# Patient Record
Sex: Female | Born: 1980
Health system: Southern US, Community
[De-identification: ages and names within clinical notes are randomized; demographics above are authoritative.]

## PROBLEM LIST (undated history)

## (undated) DIAGNOSIS — K219 Gastro-esophageal reflux disease without esophagitis: Secondary | ICD-10-CM

## (undated) DIAGNOSIS — E785 Hyperlipidemia, unspecified: Secondary | ICD-10-CM

## (undated) DIAGNOSIS — I2510 Atherosclerotic heart disease of native coronary artery without angina pectoris: Secondary | ICD-10-CM

## (undated) DIAGNOSIS — I10 Essential (primary) hypertension: Secondary | ICD-10-CM

## (undated) DIAGNOSIS — D849 Immunodeficiency, unspecified: Secondary | ICD-10-CM

## (undated) DIAGNOSIS — J45909 Unspecified asthma, uncomplicated: Secondary | ICD-10-CM

## (undated) HISTORY — PX: ANKLE SURGERY: SHX546

## (undated) HISTORY — DX: Essential (primary) hypertension: I10

## (undated) HISTORY — DX: Hyperlipidemia, unspecified: E78.5

## (undated) HISTORY — DX: Unspecified asthma, uncomplicated: J45.909

## (undated) HISTORY — DX: Gastro-esophageal reflux disease without esophagitis: K21.9

## (undated) HISTORY — DX: Atherosclerotic heart disease of native coronary artery without angina pectoris: I25.10

---

## 2006-12-19 ENCOUNTER — Other Ambulatory Visit: Admission: RE | Admit: 2006-12-19 | Discharge: 2006-12-19 | Payer: Self-pay | Admitting: Family Medicine

## 2016-02-02 ENCOUNTER — Other Ambulatory Visit: Payer: Self-pay | Admitting: Ophthalmology

## 2016-02-02 DIAGNOSIS — H04013 Acute dacryoadenitis, bilateral lacrimal glands: Secondary | ICD-10-CM

## 2016-02-05 ENCOUNTER — Other Ambulatory Visit: Payer: Self-pay | Admitting: Ophthalmology

## 2016-02-05 ENCOUNTER — Ambulatory Visit
Admission: RE | Admit: 2016-02-05 | Discharge: 2016-02-05 | Disposition: A | Discharge status: Home / Self Care | Payer: Self-pay | Source: Ambulatory Visit | Attending: Ophthalmology | Admitting: Ophthalmology

## 2016-02-05 DIAGNOSIS — H04013 Acute dacryoadenitis, bilateral lacrimal glands: Secondary | ICD-10-CM

## 2016-02-05 MED ORDER — IOPAMIDOL (ISOVUE-300) INJECTION 61%
75.0000 mL | Freq: Once | INTRAVENOUS | Status: AC | PRN
  Administered 2016-02-05: 75 mL via INTRAVENOUS

## 2016-02-11 ENCOUNTER — Other Ambulatory Visit: Payer: Self-pay

## 2016-03-01 ENCOUNTER — Ambulatory Visit: Payer: BLUE CROSS/BLUE SHIELD | Admitting: Allergy and Immunology

## 2017-01-12 ENCOUNTER — Ambulatory Visit (INDEPENDENT_AMBULATORY_CARE_PROVIDER_SITE_OTHER): Payer: Managed Care, Other (non HMO)

## 2017-01-12 ENCOUNTER — Encounter (HOSPITAL_COMMUNITY): Payer: Self-pay | Admitting: *Deleted

## 2017-01-12 ENCOUNTER — Other Ambulatory Visit: Payer: Self-pay

## 2017-01-12 ENCOUNTER — Ambulatory Visit (HOSPITAL_COMMUNITY)
Admission: EM | Admit: 2017-01-12 | Discharge: 2017-01-12 | Disposition: A | Discharge status: Home / Self Care | Payer: Managed Care, Other (non HMO)

## 2017-01-12 DIAGNOSIS — R079 Chest pain, unspecified: Secondary | ICD-10-CM

## 2017-01-12 DIAGNOSIS — R5383 Other fatigue: Secondary | ICD-10-CM | POA: Diagnosis not present

## 2017-01-12 DIAGNOSIS — R1110 Vomiting, unspecified: Secondary | ICD-10-CM

## 2017-01-12 DIAGNOSIS — R0789 Other chest pain: Secondary | ICD-10-CM | POA: Diagnosis not present

## 2017-01-12 HISTORY — DX: Immunodeficiency, unspecified: D84.9

## 2017-01-12 NOTE — ED Provider Notes (Signed)
[facility]    CSN: [id number] Arrival date & time: 01/12/17  1116     History   Chief Complaint Chief Complaint  Patient presents with  . Chest Pain    HPI Brittney Bass is a 36 y.o. female.   36 year old female presents with exertional chest pain is off and on for approximately one week. The pain is located to the upper mid anterior chest. No radiation. She states that it occurs with walking short distances but not at rest. After walking and having the chest pain rest  updates the pain. last evening she had an episode of vomiting after she ate dinner. She did not feel sick or have abdominal pain prior to this episode. Pain is not affected by movement or body position. Or deep breathing. She describes it as a pressure feeling. No associated dyspnea or exertional dyspnea. Denies family history of cardiovascular disease or emboli or death prior to age 43. Nonsmoker, denies personal medical history of hypertension or other problems. Nonsmoker. Currently not having any type of chest discomfort. Sitting on the exam table, relaxed posturing, speech showing no signs of distress.      Past Medical History:  Diagnosis Date  . Immune deficiency disorder (HCC)     There are no active problems to display for this patient.   History reviewed. No pertinent surgical history.  OB History    No data available       Home Medications    Prior to Admission medications   Medication Sig Start Date End Date Taking? Authorizing Provider  Hydroxychloroquine Sulfate (PLAQUENIL PO) Take by mouth.   Yes [provider]    Family History History reviewed. No pertinent family history. No CAD, early deaths, heart arythmias.  Social History Social History  Substance Use Topics  . Smoking status: Not on file  . Smokeless tobacco: Not on file  . Alcohol use No     Allergies   Patient has no known allergies.   Review of Systems Review of Systems    Constitutional: Positive for activity change and fatigue.  HENT: Negative.   Respiratory: Negative for cough, shortness of breath and stridor.   Cardiovascular: Positive for chest pain. Negative for palpitations and leg swelling.  Gastrointestinal: Positive for vomiting. Negative for abdominal pain.  Genitourinary: Negative.   Musculoskeletal: Negative.   Skin: Negative.   Neurological: Negative.      Physical Exam Triage Vital Signs ED Triage Vitals [01/12/17 1133]  Enc Vitals Group     BP 127/73     Pulse Rate 86     Resp 16     Temp 98.8 F (37.1 C)     Temp Source Oral     SpO2 100 %     Weight 220 lb (99.8 kg)     Height 5\' 7"  (1.702 m)     Head Circumference      Peak Flow      Pain Score      Pain Loc      Pain Edu?      Excl. in GC?    No data found.   Updated Vital Signs BP 127/73 (BP Location: Right Arm)   Pulse 86   Temp 98.8 F (37.1 C) (Oral)   Resp 16   Ht 5\' 7"  (1.702 m)   Wt 220 lb (99.8 kg)   LMP 01/05/2017   SpO2 100%   BMI 34.46 kg/m   Visual Acuity Right Eye Distance:  Left Eye Distance:   Bilateral Distance:    Right Eye Near:   Left Eye Near:    Bilateral Near:     Physical Exam  Constitutional: She is oriented to person, place, and time. She appears well-developed and well-nourished. No distress.  HENT:  Head: Normocephalic and atraumatic.  Eyes: Pupils are equal, round, and reactive to light. EOM are normal.  Neck: Normal range of motion. Neck supple. No JVD present.  Cardiovascular: Normal rate, regular rhythm, normal heart sounds and intact distal pulses.   Pulmonary/Chest: Effort normal and breath sounds normal. No respiratory distress. She has no wheezes. She has no rales. She exhibits no tenderness.  Abdominal: Soft. She exhibits no mass. There is no tenderness. There is no rebound and no guarding.  Musculoskeletal: Normal range of motion. She exhibits no edema.  Lymphadenopathy:    She has no cervical adenopathy.   Neurological: She is alert and oriented to person, place, and time. No cranial nerve deficit.  Skin: Skin is warm and dry. Capillary refill takes less than 2 seconds. She is not diaphoretic.  Psychiatric: She has a normal mood and affect.  Nursing note and vitals reviewed.    UC Treatments / Results  Labs (all labs ordered are listed, but only abnormal results are displayed) Labs Reviewed - No data to display  EKG  EKG Interpretation None     ED ECG REPORT   Date: 01/12/2017  Rate: 100  Rhythm: normal sinus rhythm  QRS Axis: normal  Intervals: normal  ST/T Wave abnormalities: nonspecific T wave changes  Conduction Disutrbances:none  Narrative Interpretation:   Old EKG Reviewed: none available  I have personally reviewed the EKG tracing and agree with the computerized printout as noted.   Radiology No results found.  Procedures Procedures (including critical care time)  Medications Ordered in UC Medications - No data to display   Initial Impression / Assessment and Plan / UC Course  I have reviewed the triage vital signs and the nursing notes.  Pertinent labs & imaging results that were available during my care of the patient were reviewed by me and considered in my medical decision making (see chart for details).    Your chest x-ray and EKG are essentially normal. They are not diagnostic of any particular condition. The source or etiology of your pain is uncertain. Despite the fact that your physical exam and test are normal it is still possible that this may be your heart or lungs. We have to consider potential serious problems first. You can have been given the choice to go to the emergency department to have tests to diagnose the problem or at least alleviate serious diagnoses at this time or to see your primary care doctor tomorrow. Knowing the potential consequences of the delay of workup at this time you have chosen to see your primary care provider tomorrow. If  you get worse, increased chest pain, shortness of breath, sweating, vomiting, getting sick, fainting or any other abnormalities you are to call 911 or go to emergency department promptly.     Final Clinical Impressions(s) / UC Diagnoses   Final diagnoses:  None    New Prescriptions New Prescriptions   No medications on file     Controlled Substance Prescriptions [place] Controlled Substance Registry consulted? Not Applicable   Hayden RasmussenMabe, Hindy Perrault, NP 01/12/17 1358

## 2017-01-12 NOTE — Discharge Instructions (Signed)
Your chest x-ray and EKG are essentially normal. They are not diagnostic of any particular condition. The source or etiology of your pain is uncertain. Despite the fact that your physical exam and test are normal it is still possible that this may be your heart or lungs. We have to consider potential serious problems first. You can have been given the choice to go to the emergency department to have tests to diagnose the problem or at least alleviate serious diagnoses at this time or to see your primary care doctor tomorrow. Knowing the potential consequences of the delay of workup at this time you have chosen to see your primary care provider tomorrow. If you get worse, increased chest pain, shortness of breath, sweating, vomiting, getting sick, fainting or any other abnormalities you are to call 911 or go to emergency department promptly.

## 2017-01-12 NOTE — ED Triage Notes (Signed)
Pt   Reports     Chest   Pain     On   Exertion  Pain is  Not     Worse  On  Deep      Or movement     It only   Seems    Worse  On   Exertion       She   Reports      Some  Fatigue   As   Well

## 2017-07-04 ENCOUNTER — Ambulatory Visit
Admission: RE | Admit: 2017-07-04 | Discharge: 2017-07-04 | Disposition: A | Discharge status: Home / Self Care | Payer: 59 | Source: Ambulatory Visit | Attending: Family Medicine | Admitting: Family Medicine

## 2017-07-04 ENCOUNTER — Other Ambulatory Visit: Payer: Self-pay | Admitting: Family Medicine

## 2017-07-04 DIAGNOSIS — R05 Cough: Secondary | ICD-10-CM

## 2017-07-04 DIAGNOSIS — R053 Chronic cough: Secondary | ICD-10-CM

## 2017-08-09 ENCOUNTER — Encounter (HOSPITAL_COMMUNITY): Payer: Self-pay | Admitting: *Deleted

## 2017-08-09 ENCOUNTER — Emergency Department (HOSPITAL_COMMUNITY)
Admission: EM | Admit: 2017-08-09 | Discharge: 2017-08-09 | Disposition: A | Discharge status: Home / Self Care | Payer: 59 | Attending: Emergency Medicine | Admitting: Emergency Medicine

## 2017-08-09 ENCOUNTER — Other Ambulatory Visit: Payer: Self-pay

## 2017-08-09 ENCOUNTER — Emergency Department (HOSPITAL_COMMUNITY): Payer: 59

## 2017-08-09 DIAGNOSIS — J9801 Acute bronchospasm: Secondary | ICD-10-CM | POA: Diagnosis not present

## 2017-08-09 DIAGNOSIS — R05 Cough: Secondary | ICD-10-CM | POA: Diagnosis present

## 2017-08-09 DIAGNOSIS — D649 Anemia, unspecified: Secondary | ICD-10-CM | POA: Diagnosis not present

## 2017-08-09 DIAGNOSIS — R059 Cough, unspecified: Secondary | ICD-10-CM

## 2017-08-09 LAB — BASIC METABOLIC PANEL
Anion gap: 11 (ref 5–15)
BUN: 5 mg/dL — ABNORMAL LOW (ref 6–20)
CO2: 23 mmol/L (ref 22–32)
Calcium: 8.5 mg/dL — ABNORMAL LOW (ref 8.9–10.3)
Chloride: 103 mmol/L (ref 101–111)
Creatinine, Ser: 0.87 mg/dL (ref 0.44–1.00)
GFR calc Af Amer: >60 mL/min (ref >60)
GFR calc non Af Amer: >60 mL/min (ref >60)
Glucose, Bld: 95 mg/dL (ref 65–99)
Potassium: 3.6 mmol/L (ref 3.5–5.1)
Sodium: 137 mmol/L (ref 135–145)

## 2017-08-09 LAB — CBC
HCT: 33.1 % — ABNORMAL LOW (ref 36.0–46.0)
Hemoglobin: 10.2 g/dL — ABNORMAL LOW (ref 12.0–15.0)
MCH: 24.6 pg — ABNORMAL LOW (ref 26.0–34.0)
MCHC: 30.8 g/dL (ref 30.0–36.0)
MCV: 80 fL (ref 78.0–100.0)
Platelets: 462 10*3/uL — ABNORMAL HIGH (ref 150–400)
RBC: 4.14 MIL/uL (ref 3.87–5.11)
RDW: 14.2 % (ref 11.5–15.5)
WBC: 8.1 10*3/uL (ref 4.0–10.5)

## 2017-08-09 MED ORDER — PREDNISONE 20 MG PO TABS
60.0000 mg | ORAL_TABLET | Freq: Once | ORAL | Status: AC
Start: 2017-08-09 — End: 2017-08-09
  Administered 2017-08-09: 60 mg via ORAL
  Filled 2017-08-09: qty 3

## 2017-08-09 MED ORDER — PREDNISONE 50 MG PO TABS: 50.0000 mg | ORAL_TABLET | Freq: Every day | ORAL | 0 refills | Status: DC

## 2017-08-09 MED ORDER — IPRATROPIUM-ALBUTEROL 0.5-2.5 (3) MG/3ML IN SOLN
3.0000 mL | Freq: Once | RESPIRATORY_TRACT | Status: AC
  Administered 2017-08-09: 3 mL via RESPIRATORY_TRACT
  Filled 2017-08-09: qty 3

## 2017-08-09 MED ORDER — ALBUTEROL SULFATE HFA 108 (90 BASE) MCG/ACT IN AERS: 1.0000 | INHALATION_SPRAY | Freq: Four times a day (QID) | RESPIRATORY_TRACT | 0 refills | Status: DC | PRN

## 2017-08-09 NOTE — ED Provider Notes (Signed)
[facility] Provider Note   CSN: [id number] Arrival date & time: 08/09/17  1639     History   Chief Complaint Chief Complaint  Patient presents with  . Cough    HPI Brittney Bass is a 37 y.o. female.  HPI Patient presented to the emergency room for evaluation of persistent cough and congestion.  Patient states she has been having trouble for the last 2 months or so.  She has a chronic cough bringing up yellow mucus.  The symptoms seem to be worse at night when she is lying flat.  She has seen her primary doctor couple times and has been on a couple rounds of antibiotics.  She has also been on steroids that seem to help temporarily.  Patient was also referred to an ENT doctor.  Patient has been on antacids.  He has no new environmental triggers.  No fevers or chills. Past Medical History:  Diagnosis Date  . Immune deficiency disorder (HCC)     There are no active problems to display for this patient.   History reviewed. No pertinent surgical history.  OB History    No data available       Home Medications    Prior to Admission medications   Medication Sig Start Date End Date Taking? Authorizing Provider  albuterol (PROVENTIL HFA;VENTOLIN HFA) 108 (90 Base) MCG/ACT inhaler Inhale 1-2 puffs into the lungs every 6 (six) hours as needed for wheezing or shortness of breath. 08/09/17   Linwood DibblesKnapp, Maloree Uplinger, MD  Hydroxychloroquine Sulfate (PLAQUENIL PO) Take by mouth.    [provider]  predniSONE (DELTASONE) 50 MG tablet Take 1 tablet (50 mg total) by mouth daily. 08/09/17   Linwood DibblesKnapp, Deina Lipsey, MD    Family History No family history on file.  Social History Social History   Tobacco Use  . Smoking status: Never Smoker  . Smokeless tobacco: Never Used  Substance Use Topics  . Alcohol use: No  . Drug use: Not on file     Allergies   Patient has no known allergies.   Review of Systems Review of Systems  All other systems reviewed and  are negative.    Physical Exam Updated Vital Signs BP 128/86 (BP Location: Right Arm)   Pulse 87   Temp 98.9 F (37.2 C) (Oral)   Resp 16   Ht 1.702 m (5\' 7" )   Wt 104.3 kg (230 lb)   LMP 07/26/2017   SpO2 100%   BMI 36.02 kg/m   Physical Exam  Constitutional: She appears well-developed and well-nourished. No distress.  HENT:  Head: Normocephalic and atraumatic.  Right Ear: External ear normal.  Left Ear: External ear normal.  Eyes: Conjunctivae are normal. Right eye exhibits no discharge. Left eye exhibits no discharge. No scleral icterus.  Neck: Neck supple. No tracheal deviation present.  Cardiovascular: Normal rate, regular rhythm and intact distal pulses.  Pulmonary/Chest: Effort normal and breath sounds normal. No stridor. No respiratory distress. She has no wheezes. She has no rales.  Frequently coughing  Abdominal: Soft. Bowel sounds are normal. She exhibits no distension. There is no tenderness. There is no rebound and no guarding.  Musculoskeletal: She exhibits no edema or tenderness.  Neurological: She is alert. She has normal strength. No cranial nerve deficit (no facial droop, extraocular movements intact, no slurred speech) or sensory deficit. She exhibits normal muscle tone. She displays no seizure activity. Coordination normal.  Skin: Skin is warm and dry. No rash noted.  Psychiatric: She has a normal mood and affect.  Nursing note and vitals reviewed.    ED Treatments / Results  Labs (all labs ordered are listed, but only abnormal results are displayed) Labs Reviewed  CBC - Abnormal; Notable for the following components:      Result Value   Hemoglobin 10.2 (*)    HCT 33.1 (*)    MCH 24.6 (*)    Platelets 462 (*)    All other components within normal limits  BASIC METABOLIC PANEL - Abnormal; Notable for the following components:   BUN 5 (*)    Calcium 8.5 (*)    All other components within normal limits    EKG  EKG  Interpretation  Date/Time:  Wednesday August 09 2017 16:46:58 EST Ventricular Rate:  88 PR Interval:  128 QRS Duration: 82 QT Interval:  390 QTC Calculation: 471 R Axis:   -24 Text Interpretation:  Normal sinus rhythm Left ventricular hypertrophy with repolarization abnormality Abnormal ECG No old tracing to compare Confirmed by Linwood Dibbles [phone]) on 08/09/2017 7:29:16 PM       Radiology Dg Chest 2 View  Result Date: 08/09/2017 CLINICAL DATA:  Productive cough. EXAM: CHEST - 2 VIEW COMPARISON:  07/04/2017 FINDINGS: The heart size and mediastinal contours are within normal limits. Both lungs are clear. The visualized skeletal structures are unremarkable. IMPRESSION: Normal exam. Electronically Signed   By: Francene Boyers M.D.   On: 08/09/2017 18:55    Procedures Procedures (including critical care time)  Medications Ordered in ED Medications  ipratropium-albuterol (DUONEB) 0.5-2.5 (3) MG/3ML nebulizer solution 3 mL (3 mLs Nebulization Given 08/09/17 1949)  predniSONE (DELTASONE) tablet 60 mg (60 mg Oral Given 08/09/17 1949)     Initial Impression / Assessment and Plan / ED Course  I have reviewed the triage vital signs and the nursing notes.  Pertinent labs & imaging results that were available during my care of the patient were reviewed by me and considered in my medical decision making (see chart for details).  Clinical Course as of Aug 09 2105  Wed Aug 09, 2017  2103 Pt is feeling much better after her breathing treatments and steroids.  No more coughing.  [JK]    Clinical Course User Index [JK] Linwood Dibbles, MD   Patient presents to the emergency room for evaluation of persistent cough for the last several months.  Patient's laboratory tests show a mild anemia but I do not think this is clinically significant.  Chest x-ray without any acute abnormality.  Patient improved after breathing treatments and steroids.  Wonder if she might be having recurrent bronchospasm as a source of  her coughing.  I will discharge her home with prescription for prednisone and inhaler.  I recommend she follow-up with her primary doctor and consider seeing a pulmonologist for possible PFTs.  Final Clinical Impressions(s) / ED Diagnoses   Final diagnoses:  Cough  Bronchospasm  Anemia, unspecified type    ED Discharge Orders        Ordered    predniSONE (DELTASONE) 50 MG tablet  Daily     08/09/17 2106    albuterol (PROVENTIL HFA;VENTOLIN HFA) 108 (90 Base) MCG/ACT inhaler  Every 6 hours PRN     08/09/17 2106       Linwood Dibbles, MD 08/09/17 2108

## 2017-08-09 NOTE — ED Triage Notes (Signed)
The pt is c/o a cold and a cough for 2 months chest congestion and soreness for the past 3 days  She has seen her regular doctor x 2 since this started  She saw a ent yesterday  Who could not  See a reason for her symptoms.  No temp just constant coughing especially when she lies down to sleep  The coughing keeps her awake and that's when her chest hurts her with the cough

## 2017-08-09 NOTE — Discharge Instructions (Signed)
Follow-up with your primary care doctor, consider seeing a pulmonologist for further evaluation, take the medications as prescribed

## 2017-09-29 ENCOUNTER — Encounter: Payer: Self-pay | Admitting: Pulmonary Disease

## 2017-09-29 ENCOUNTER — Other Ambulatory Visit (INDEPENDENT_AMBULATORY_CARE_PROVIDER_SITE_OTHER): Payer: 59

## 2017-09-29 ENCOUNTER — Telehealth: Payer: Self-pay | Admitting: Pulmonary Disease

## 2017-09-29 ENCOUNTER — Ambulatory Visit: Payer: 59 | Admitting: Pulmonary Disease

## 2017-09-29 VITALS — BP 140/98 | HR 109 | Ht 67.0 in | Wt 232.6 lb

## 2017-09-29 DIAGNOSIS — R05 Cough: Secondary | ICD-10-CM | POA: Diagnosis not present

## 2017-09-29 DIAGNOSIS — L509 Urticaria, unspecified: Secondary | ICD-10-CM | POA: Diagnosis not present

## 2017-09-29 DIAGNOSIS — R059 Cough, unspecified: Secondary | ICD-10-CM

## 2017-09-29 LAB — CBC WITH DIFFERENTIAL/PLATELET
Basophils Absolute: 0.1 K/uL (ref 0.0–0.1)
Basophils Relative: 0.7 % (ref 0.0–3.0)
Eosinophils Absolute: 1.1 K/uL — ABNORMAL HIGH (ref 0.0–0.7)
Eosinophils Relative: 14.4 % — ABNORMAL HIGH (ref 0.0–5.0)
HCT: 30.5 % — ABNORMAL LOW (ref 36.0–46.0)
Hemoglobin: 9.6 g/dL — ABNORMAL LOW (ref 12.0–15.0)
Lymphocytes Relative: 19.8 % (ref 12.0–46.0)
Lymphs Abs: 1.5 K/uL (ref 0.7–4.0)
MCHC: 31.5 g/dL (ref 30.0–36.0)
MCV: 74.1 fl — ABNORMAL LOW (ref 78.0–100.0)
Monocytes Absolute: 0.7 K/uL (ref 0.1–1.0)
Monocytes Relative: 8.7 % (ref 3.0–12.0)
Neutro Abs: 4.3 K/uL (ref 1.4–7.7)
Neutrophils Relative %: 56.4 % (ref 43.0–77.0)
Platelets: 494 K/uL — ABNORMAL HIGH (ref 150.0–400.0)
RBC: 4.12 Mil/uL (ref 3.87–5.11)
RDW: 14.8 % (ref 11.5–15.5)
WBC: 7.7 K/uL (ref 4.0–10.5)

## 2017-09-29 LAB — NITRIC OXIDE: Nitric Oxide: 61

## 2017-09-29 MED ORDER — PREDNISONE 10 MG PO TABS: ORAL_TABLET | ORAL | 0 refills | Status: DC

## 2017-09-29 MED ORDER — ALBUTEROL SULFATE HFA 108 (90 BASE) MCG/ACT IN AERS: 1.0000 | INHALATION_SPRAY | Freq: Four times a day (QID) | RESPIRATORY_TRACT | 1 refills | Status: DC | PRN

## 2017-09-29 MED ORDER — DIPHENHYDRAMINE HCL 50 MG/ML IJ SOLN
25.0000 mg | Freq: Once | INTRAMUSCULAR | Status: AC
  Administered 2017-09-29: 25 mg via INTRAMUSCULAR

## 2017-09-29 MED ORDER — METHYLPREDNISOLONE ACETATE 80 MG/ML IJ SUSP
80.0000 mg | Freq: Once | INTRAMUSCULAR | Status: AC
  Administered 2017-09-29: 80 mg via INTRAMUSCULAR

## 2017-09-29 MED ORDER — FLUTICASONE FUROATE-VILANTEROL 200-25 MCG/INH IN AEPB: 1.0000 | INHALATION_SPRAY | Freq: Every day | RESPIRATORY_TRACT | 0 refills | Status: DC

## 2017-09-29 NOTE — Progress Notes (Signed)
Brittney Bass    [id number]    1980-10-18  Primary Care Physician:Dewey, Christell Constant, MD  Referring Physician: Donnetta Hail, MD [address] [place], [place] [address]  Chief complaint: Consult for cough  HPI: 37 year old with history of connective tissue disease, possible Sjogren's.  Comes with complains of cough since December 2019 Initially this was thought to be secondary to GERD.  She has given omeprazole or Nexium with no improvement of symptoms.  She is also seen ENT who diagnosed her with acute sinusitis and was treated with doxycycline x2 and a Z-Pak She had been to the ED in the last 3 months and given prednisone and albuterol inhaler which with only thinks that helped with her breathing.  However symptoms worsen after the prednisone was stopped.  Chief complaint today is cough, dyspnea, wheezing which keeps her up at night.  Denies any sputum production, fevers, chills. She is being followed by Dr. Dierdre Forth for connective tissue disease, likely Sjogren's.  This was diagnosed 2 years ago when she presented with eyelid swelling.  She is been currently maintained on Plaquenil She had allergy testing several years ago and was told she was sensitive to mold, dust, grass, trees.  Pets: She has a dog. Occupation: Child psychotherapist Exposures: No dampness, mold.  She uses a humidifier at home.  She has a bird feeder in the backyard and recently joined work in a new facility with 2 indoor birds Smoking history: Never smoker Travel history: Not significant  Outpatient Encounter Medications as of 09/29/2017  Medication Sig  . albuterol (PROVENTIL HFA;VENTOLIN HFA) 108 (90 Base) MCG/ACT inhaler Inhale 1-2 puffs into the lungs every 6 (six) hours as needed for wheezing or shortness of breath.  . cetirizine (ZYRTEC) 10 MG tablet Take 10 mg by mouth daily.  . Hydroxychloroquine Sulfate (PLAQUENIL PO) Take by mouth.  . montelukast (SINGULAIR) 10 MG tablet   .  [DISCONTINUED] predniSONE (DELTASONE) 50 MG tablet Take 1 tablet (50 mg total) by mouth daily.   No facility-administered encounter medications on file as of 09/29/2017.     Allergies as of 09/29/2017 - Review Complete 09/29/2017  Allergen Reaction Noted  . Ibuprofen Hives 09/29/2017  . Penicillins Itching 09/29/2017  . Xanax [alprazolam] Itching 09/29/2017    Past Medical History:  Diagnosis Date  . Immune deficiency disorder [facility])     Past Surgical History:  Procedure Laterality Date  . ANKLE SURGERY      Family History  Problem Relation Age of Onset  . Hypertension Father   . Diabetes Maternal Grandmother   . Diabetes Paternal Grandmother   . Cancer Paternal Grandmother     Social History   Socioeconomic History  . Marital status: Single    Spouse name: Not on file  . Number of children: Not on file  . Years of education: Not on file  . Highest education level: Not on file  Occupational History  . Not on file  Social Needs  . Financial resource strain: Not on file  . Food insecurity:    Worry: Not on file    Inability: Not on file  . Transportation needs:    Medical: Not on file    Non-medical: Not on file  Tobacco Use  . Smoking status: Never Smoker  . Smokeless tobacco: Never Used  Substance and Sexual Activity  . Alcohol use: No  . Drug use: Never  . Sexual activity: Never  Lifestyle  . Physical activity:    Days per week: Not on file    Minutes per session: Not on file  . Stress: Not on file  Relationships  . Social connections:    Talks on phone: Not on file    Gets together: Not on file    Attends religious service: Not on file    Active member of club or organization: Not on file    Attends meetings of clubs or organizations: Not on file    Relationship status: Not on file  . Intimate partner violence:    Fear of current or ex partner: Not on file    Emotionally abused: Not on file    Physically abused: Not on file    Forced sexual  activity: Not on file  Other Topics Concern  . Not on file  Social History Narrative  . Not on file   Review of systems: Review of Systems  Constitutional: Negative for fever and chills.  HENT: Negative.   Eyes: Negative for blurred vision.  Respiratory: as per HPI  Cardiovascular: Negative for chest pain and palpitations.  Gastrointestinal: Negative for vomiting, diarrhea, blood per rectum. Genitourinary: Negative for dysuria, urgency, frequency and hematuria.  Musculoskeletal: Negative for myalgias, back pain and joint pain.  Skin: Negative for itching and rash.  Neurological: Negative for dizziness, tremors, focal weakness, seizures and loss of consciousness.  Endo/Heme/Allergies: Negative for environmental allergies.  Psychiatric/Behavioral: Negative for depression, suicidal ideas and hallucinations.  All other systems reviewed and are negative.  Physical Exam: Blood pressure (!) 140/98, pulse (!) 109, height 5\' 7"  (1.702 m), weight 232 lb 9.6 oz (105.5 kg), SpO2 98 %. Gen:      No acute distress HEENT:  EOMI, sclera anicteric Neck:     No masses; no thyromegaly Lungs:   Scattered expiratory wheeze CV:         Regular rate and rhythm; no murmurs Abd:      + bowel sounds; soft, non-tender; no palpable masses, no distension Ext:    No edema; adequate peripheral perfusion Skin:      Warm and dry; no rash Neuro: alert and oriented x 3 Psych: normal mood and affect  Data Reviewed: Chest x-ray 08/09/2017- clear lungs Chest x-ray 07/04/2017- no active cardiopulmonary disease. I have reviewed the images personally  FENO 09/29/2017-61  Assessment:  Assessment for dyspnea Likely has asthma, reactive airway disease given the elevated FENO, examination with bilateral wheeze. She appears to be in the middle of an exacerbation today  We will treat with Depo-Medrol shot and a prednisone taper starting at 40 mg.  Reduce dose by 10 mg every 3 days Start Breo inhaler, continue albuterol  as needed Check CBC differential, blood allergy profile, PFTs  Plan/Recommendations: - Depot shot, prednisone - Start Breo inhaler, continue albuterol as needed - Check CBC differential, blood allergy profile, PFTs  Chilton GreathousePraveen Tae Robak MD [facility] 09/29/2017, 9:34 AM  CC: Donnetta HailBeekman, James F, MD   Addendum: After getting her DepoMedrol inj she developed hives which responded to Benadryl There was no evidence of worsening breathing, wheezing, stridor

## 2017-09-29 NOTE — Patient Instructions (Addendum)
We will give you a depo shot today and a prednisone starting at 40 mg. Reduce prednisone dose by 10 mg every 3  days We will start you on breo 200 inhaler, continue albuterol as needed Check CBC with differential, blood allergy profile. We will schedule you for pulmonary function test Follow-up 1 month.

## 2017-09-29 NOTE — Telephone Encounter (Signed)
Spoke with pt, advised her that 90 day supply will be sent in for ProAir as insurance requested. Pt understood and nothing further is needed.

## 2017-10-02 LAB — RESPIRATORY ALLERGY PROFILE REGION II ~~LOC~~
Allergen, A. alternata, m6: 1.49 kU/L — ABNORMAL HIGH
Allergen, Cedar tree, t12: 1.06 kU/L — ABNORMAL HIGH
Allergen, Comm Silver Birch, t9: 0.72 kU/L — ABNORMAL HIGH
Allergen, Cottonwood, t14: 0.91 kU/L — ABNORMAL HIGH
Allergen, D pternoyssinus,d7: 1.23 kU/L — ABNORMAL HIGH
Allergen, Mouse Urine Protein, e78: 0.39 kU/L — ABNORMAL HIGH
Allergen, Mulberry, t76: 0.71 kU/L — ABNORMAL HIGH
Allergen, Oak,t7: 0.92 kU/L — ABNORMAL HIGH
Allergen, P. notatum, m1: 3.53 kU/L — ABNORMAL HIGH
Aspergillus fumigatus, m3: 4.61 kU/L — ABNORMAL HIGH
Bermuda Grass: 1.15 kU/L — ABNORMAL HIGH
Box Elder IgE: 1.02 kU/L — ABNORMAL HIGH
CLADOSPORIUM HERBARUM (M2) IGE: 1.36 kU/L — ABNORMAL HIGH
COMMON RAGWEED (SHORT) (W1) IGE: 1.19 kU/L — ABNORMAL HIGH
Cat Dander: 0.68 kU/L — ABNORMAL HIGH
Class: 1
Class: 1
Class: 1
Class: 1
Class: 2
Class: 2
Class: 2
Class: 2
Class: 2
Class: 2
Class: 2
Class: 2
Class: 2
Class: 2
Class: 2
Class: 2
Class: 2
Class: 2
Class: 2
Class: 2
Class: 2
Class: 2
Class: 3
Class: 3
Cockroach: 0.46 kU/L — ABNORMAL HIGH
D. farinae: 1.29 kU/L — ABNORMAL HIGH
Dog Dander: 2.11 kU/L — ABNORMAL HIGH
Elm IgE: 1.03 kU/L — ABNORMAL HIGH
IgE (Immunoglobulin E), Serum: 3328 kU/L — ABNORMAL HIGH (ref <=114)
Johnson Grass: 2.14 kU/L — ABNORMAL HIGH
Pecan/Hickory Tree IgE: 0.69 kU/L — ABNORMAL HIGH
Rough Pigweed  IgE: 0.8 kU/L — ABNORMAL HIGH
Sheep Sorrel IgE: 0.79 kU/L — ABNORMAL HIGH
Timothy Grass: 1.99 kU/L — ABNORMAL HIGH

## 2017-10-02 LAB — INTERPRETATION:

## 2017-10-03 ENCOUNTER — Ambulatory Visit (INDEPENDENT_AMBULATORY_CARE_PROVIDER_SITE_OTHER): Payer: 59 | Admitting: Pulmonary Disease

## 2017-10-03 DIAGNOSIS — R05 Cough: Secondary | ICD-10-CM

## 2017-10-03 DIAGNOSIS — R059 Cough, unspecified: Secondary | ICD-10-CM

## 2017-10-03 LAB — PULMONARY FUNCTION TEST
DL/VA % pred: 113 %
DL/VA: 5.7 ml/min/mmHg/L
DLCO cor % pred: 94 %
DLCO cor: 25.54 ml/min/mmHg
DLCO unc % pred: 81 %
DLCO unc: 21.96 ml/min/mmHg
FEF 25-75 Post: 4.07 L/sec
FEF 25-75 Pre: 3.29 L/sec
FEF2575-%Change-Post: 23 %
FEF2575-%Pred-Post: 129 %
FEF2575-%Pred-Pre: 105 %
FEV1-%Change-Post: 7 %
FEV1-%Pred-Post: 99 %
FEV1-%Pred-Pre: 92 %
FEV1-Post: 2.79 L
FEV1-Pre: 2.59 L
FEV1FVC-%Change-Post: 5 %
FEV1FVC-%Pred-Pre: 100 %
FEV6-%Change-Post: 2 %
FEV6-%Pred-Post: 94 %
FEV6-%Pred-Pre: 92 %
FEV6-Post: 3.14 L
FEV6-Pre: 3.06 L
FEV6FVC-%Pred-Post: 102 %
FEV6FVC-%Pred-Pre: 102 %
FVC-%Change-Post: 2 %
FVC-%Pred-Post: 93 %
FVC-%Pred-Pre: 90 %
FVC-Post: 3.14 L
FVC-Pre: 3.06 L
Post FEV1/FVC ratio: 89 %
Post FEV6/FVC ratio: 100 %
Pre FEV1/FVC ratio: 84 %
Pre FEV6/FVC Ratio: 100 %
RV % pred: 112 %
RV: 1.8 L
TLC % pred: 96 %
TLC: 5.18 L

## 2017-10-03 NOTE — Progress Notes (Addendum)
Patient arrived to full PFT and had albuterol inhaler 3 hours prior to testing. Spoke with Dr. Isaiah Serge regarding patient having 2 puffs of albuterol inhaler prior to testing, he states that patient is not to have any inhaler day of testing.   Verbalized this information to the patient, she is aware and verbalized understanding. Patient will reschedule the testing in July 2019 once she has new insurance again with her new job.  Per Robynn Pane and Dr. Isaiah Serge, the test was able to be completed today.  Patient completed full PFT today.

## 2017-10-19 ENCOUNTER — Telehealth: Payer: Self-pay | Admitting: Pulmonary Disease

## 2017-10-19 MED ORDER — FLUTICASONE FUROATE-VILANTEROL 200-25 MCG/INH IN AEPB: 1.0000 | INHALATION_SPRAY | Freq: Every day | RESPIRATORY_TRACT | 0 refills | Status: DC

## 2017-10-19 NOTE — Telephone Encounter (Signed)
Called and spoke with pt letting her know we did have samples of Breo that she could come pick up at her convenience.  Also stated to pt the results of her labwork.  Pt stated to me her insurance comes avail July 1 so she would then call us after that to schedule her f/u appt.  Samples placed up front for pt.  Nothing further needed at this time.

## 2017-11-22 ENCOUNTER — Telehealth: Payer: Self-pay | Admitting: Pulmonary Disease

## 2017-11-22 NOTE — Telephone Encounter (Addendum)
Pt was supposed to follow up in 1 month. breo samples left up front for pt. She stated she will call to make an appt when her insurance starts because she just started a new job. I advised pt to follow up at that time. Nothing further is needed.     Office Visit   09/29/2017 [facility]    Chilton Greathouse, MD  Pulmonary Disease   Cough +1 more  Dx   pulmonary consult   ; Referred by Donnetta Hail, MD  Reason for Visit   Additional Documentation   Vitals:   BP 140/98 (BP Location: Left Arm, Cuff Size: Normal)   Pulse 109    Ht 5\' 7"  (1.702 m)   Wt 232 lb 9.6 oz (105.5 kg)   SpO2 98%   BMI 36.43 kg/m   BSA 2.23 m      More Vitals   Flowsheets:   Extended Vitals,   MEWS Score,   Anthropometrics     Encounter Info:   Billing Info,   History,   Allergies,   Detailed Report     All Notes   Progress Notes by Chilton Greathouse, MD at 09/29/2017 9:00 AM  Author: Chilton Greathouse, MD Author Type: Physician Filed: 09/29/2017 1:37 PM  Note Status: Signed Cosign: Cosign Not Required Encounter Date: 09/29/2017  Editor: Chilton Greathouse, MD (Physician)                                                                                                                   Brittney Bass                                             [id number]                                          Aug 15, 1980  Primary Care Physician:Dewey, Christell Constant, MD  Referring Physician: Donnetta Hail, MD [address] [place], [place] [address]  Chief complaint: Consult for cough  HPI: 37 year old with history of connective tissue disease, possible Sjogren's.  Comes with complains of cough since December 2019 Initially this was thought to be secondary to GERD.  She has given omeprazole or Nexium with no improvement of symptoms.  She is also seen ENT who diagnosed her with acute sinusitis and was treated with doxycycline x2 and a Z-Pak She had been to the ED in the last 3  months and given prednisone and albuterol inhaler which with only thinks that helped with her breathing.  However symptoms worsen after the prednisone was stopped.  Chief complaint today is cough, dyspnea, wheezing which keeps her up at night.  Denies any sputum production, fevers, chills. She is being followed by Dr. Dierdre Forth for connective tissue  disease, likely Sjogren's.  This was diagnosed 2 years ago when she presented with eyelid swelling.  She is been currently maintained on Plaquenil She had allergy testing several years ago and was told she was sensitive to mold, dust, grass, trees.  Pets: She has a dog. 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  She uses a humidifier at home.  She has a bird feeder in the backyard and recently joined work in a new facility with 2 indoor birds Smoking history: Never smoker Travel history: Not significant      Outpatient Encounter Medications as of 09/29/2017  Medication Sig  . albuterol (PROVENTIL HFA;VENTOLIN HFA) 108 (90 Base) MCG/ACT inhaler Inhale 1-2 puffs into the lungs every 6 (six) hours as needed for wheezing or shortness of breath.  . cetirizine (ZYRTEC) 10 MG tablet Take 10 mg by mouth daily.  . Hydroxychloroquine Sulfate (PLAQUENIL PO) Take by mouth.  . montelukast (SINGULAIR) 10 MG tablet   . [DISCONTINUED] predniSONE (DELTASONE) 50 MG tablet Take 1 tablet (50 mg total) by mouth daily.   No facility-administered encounter medications on file as of 09/29/2017.          Allergies as of 09/29/2017 - Review Complete 09/29/2017  Allergen Reaction Noted  . Ibuprofen Hives 09/29/2017  . Penicillins Itching 09/29/2017  . Xanax [alprazolam] Itching 09/29/2017        Past Medical History:  Diagnosis Date  . Immune deficiency disorder [facility](HCC)          Past Surgical History:  Procedure Laterality Date  . ANKLE SURGERY           Family History  Problem Relation Age of Onset  . Hypertension Father   .  Diabetes Maternal Grandmother   . Diabetes Paternal Grandmother   . Cancer Paternal Grandmother     Social History        Socioeconomic History  . Marital status: Single    Spouse name: Not on file  . Number of children: Not on file  . Years of education: Not on file  . Highest education level: Not on file  Occupational History  . Not on file  Social Needs  . Financial resource strain: Not on file  . Food insecurity:    Worry: Not on file    Inability: Not on file  . Transportation needs:    Medical: Not on file    Non-medical: Not on file  Tobacco Use  . Smoking status: Never Smoker  . Smokeless tobacco: Never Used  Substance and Sexual Activity  . Alcohol use: No  . Drug use: Never  . Sexual activity: Never  Lifestyle  . Physical activity:    Days per week: Not on file    Minutes per session: Not on file  . Stress: Not on file  Relationships  . Social connections:    Talks on phone: Not on file    Gets together: Not on file    Attends religious service: Not on file    Active member of club or organization: Not on file    Attends meetings of clubs or organizations: Not on file    Relationship status: Not on file  . Intimate partner violence:    Fear of current or ex partner: Not on file    Emotionally abused: Not on file    Physically abused: Not on file    Forced sexual activity: Not on file  Other Topics Concern  . Not on file  Social History Narrative  .  Not on file   Review of systems: Review of Systems  Constitutional: Negative for fever and chills.  HENT: Negative.   Eyes: Negative for blurred vision.  Respiratory: as per HPI  Cardiovascular: Negative for chest pain and palpitations.  Gastrointestinal: Negative for vomiting, diarrhea, blood per rectum. Genitourinary: Negative for dysuria, urgency, frequency and hematuria.  Musculoskeletal: Negative for myalgias, back pain and joint pain.  Skin: Negative  for itching and rash.  Neurological: Negative for dizziness, tremors, focal weakness, seizures and loss of consciousness.  Endo/Heme/Allergies: Negative for environmental allergies.  Psychiatric/Behavioral: Negative for depression, suicidal ideas and hallucinations.  All other systems reviewed and are negative.  Physical Exam: Blood pressure (!) 140/98, pulse (!) 109, height 5\' 7"  (1.702 m), weight 232 lb 9.6 oz (105.5 kg), SpO2 98 %. Gen:       No acute distress HEENT:  EOMI, sclera anicteric Neck:     No masses; no thyromegaly Lungs:   Scattered expiratory wheeze CV:         Regular rate and rhythm; no murmurs Abd:        + bowel sounds; soft, non-tender; no palpable masses, no distension Ext:         No edema; adequate peripheral perfusion Skin:      Warm and dry; no rash Neuro: alert and oriented x 3 Psych: normal mood and affect  Data Reviewed: Chest x-ray 08/09/2017- clear lungs Chest x-ray 07/04/2017- no active cardiopulmonary disease. I have reviewed the images personally  FENO 09/29/2017-61  Assessment:  Assessment for dyspnea Likely has asthma, reactive airway disease given the elevated FENO, examination with bilateral wheeze. She appears to be in the middle of an exacerbation today  We will treat with Depo-Medrol shot and a prednisone taper starting at 40 mg.  Reduce dose by 10 mg every 3 days Start Breo inhaler, continue albuterol as needed Check CBC differential, blood allergy profile, PFTs  Plan/Recommendations: - Depot shot, prednisone - Start Breo inhaler, continue albuterol as needed - Check CBC differential, blood allergy profile, PFTs  Chilton Greathouse MD [facility] 09/29/2017, 9:34 AM  CC: Donnetta Hail, MD   Addendum: After getting her DepoMedrol inj she developed hives which responded to Benadryl There was no evidence of worsening breathing, wheezing, stridor      Patient Instructions by Chilton Greathouse, MD at  09/29/2017 9:00 AM  Author: Chilton Greathouse, MD Author Type: Physician Filed: 09/29/2017 9:52 AM  Note Status: Addendum Cosign: Cosign Not Required Encounter Date: 09/29/2017  Editor: Chilton Greathouse, MD (Physician)  Prior Versions: 1. Chilton Greathouse, MD (Physician) at 09/29/2017 9:52 AM - Addendum   2. Chilton Greathouse, MD (Physician) at 09/29/2017 9:50 AM - Signed    We will give you a depo shot today and a prednisone starting at 40 mg. Reduce prednisone dose by 10 mg every 3  days We will start you on breo 200 inhaler, continue albuterol as needed Check CBC with differential, blood allergy profile. We will schedule you for pulmonary function test Follow-up 1 month.

## 2017-12-18 ENCOUNTER — Telehealth: Payer: Self-pay | Admitting: Pulmonary Disease

## 2017-12-18 MED ORDER — FLUTICASONE FUROATE-VILANTEROL 200-25 MCG/INH IN AEPB: 1.0000 | INHALATION_SPRAY | Freq: Every day | RESPIRATORY_TRACT | 11 refills | Status: DC

## 2017-12-18 NOTE — Telephone Encounter (Signed)
rx sent to pt's pharmacy of breo.  Called and spoke with pt letting her know this had been done. Pt expressed understanding. Nothing further needed.

## 2017-12-20 DIAGNOSIS — R590 Localized enlarged lymph nodes: Secondary | ICD-10-CM | POA: Diagnosis not present

## 2017-12-20 DIAGNOSIS — M2550 Pain in unspecified joint: Secondary | ICD-10-CM | POA: Diagnosis not present

## 2017-12-20 DIAGNOSIS — D8989 Other specified disorders involving the immune mechanism, not elsewhere classified: Secondary | ICD-10-CM | POA: Diagnosis not present

## 2017-12-20 DIAGNOSIS — D721 Eosinophilia: Secondary | ICD-10-CM | POA: Diagnosis not present

## 2017-12-20 DIAGNOSIS — H02849 Edema of unspecified eye, unspecified eyelid: Secondary | ICD-10-CM | POA: Diagnosis not present

## 2018-01-02 DIAGNOSIS — D721 Eosinophilia: Secondary | ICD-10-CM | POA: Diagnosis not present

## 2018-01-02 DIAGNOSIS — D8989 Other specified disorders involving the immune mechanism, not elsewhere classified: Secondary | ICD-10-CM | POA: Diagnosis not present

## 2018-01-02 DIAGNOSIS — H02849 Edema of unspecified eye, unspecified eyelid: Secondary | ICD-10-CM | POA: Diagnosis not present

## 2018-02-01 ENCOUNTER — Ambulatory Visit (INDEPENDENT_AMBULATORY_CARE_PROVIDER_SITE_OTHER): Payer: BLUE CROSS/BLUE SHIELD | Admitting: Pulmonary Disease

## 2018-02-01 ENCOUNTER — Encounter: Payer: Self-pay | Admitting: Pulmonary Disease

## 2018-02-01 VITALS — BP 124/72 | HR 93 | Ht 67.0 in | Wt 225.0 lb

## 2018-02-01 DIAGNOSIS — J4540 Moderate persistent asthma, uncomplicated: Secondary | ICD-10-CM

## 2018-02-01 NOTE — Patient Instructions (Signed)
I am glad that [facility] is [facility] We will continue the same Follow-up in 6 months.

## 2018-02-01 NOTE — Progress Notes (Signed)
[name] Brittney Bass    [id number]    Jun 27, 1980  Primary Care Physician:Dewey, Christell Constant, MD  Referring Physician: Lewis Moccasin, MD [address] [place], [place] [address]  Chief complaint: Follow up for mod persistent asthma, allergies  HPI: 37 year old with history of connective tissue disease, possible Sjogren's.  Comes with complains of cough since December 2019 Initially this was thought to be secondary to GERD.  She has given omeprazole or Nexium with no improvement of symptoms.  She is also seen ENT who diagnosed her with acute sinusitis and was treated with doxycycline x2 and a Z-Pak She had been to the ED in the last 3 months and given prednisone and albuterol inhaler which with only thinks that helped with her breathing.  However symptoms worsen after the prednisone was stopped.  She is being followed by Dr. Dierdre Forth for connective tissue disease, likely Sjogren's.  This was diagnosed 2 years ago when she presented with eyelid swelling.  She is been currently maintained on Plaquenil She had allergy testing several years ago and was told she was sensitive to mold, dust, grass, trees.   Pets: She has a dog. Occupation: Child psychotherapist Exposures: No dampness, mold.  She uses a humidifier at home.  She has a bird feeder in the backyard and recently joined work in a new facility with 2 indoor birds Smoking history: Never smoker Travel history: Not significant  Interim history: Started on breo in April.  Developed hives afer depo shot in office which resolved with benadryl  Feels Breo has improved her breathing.  She has not required any prednisone for her asthma.  No need for rescue inhaler. She is on a prednisone taper now for arthritis per Dr. Dierdre Forth.  Physical Exam: Blood pressure 124/72, pulse 93, height 5\' 7"  (1.702 m), weight 225 lb (102.1 kg), SpO2 98 %. Gen:      No acute distress HEENT:  EOMI, sclera anicteric Neck:     No masses; no  thyromegaly Lungs:    Clear to auscultation bilaterally; normal respiratory effort CV:         Regular rate and rhythm; no murmurs Abd:      + bowel sounds; soft, non-tender; no palpable masses, no distension Ext:    No edema; adequate peripheral perfusion Skin:      Warm and dry; no rash Neuro: alert and oriented x 3 Psych: normal mood and affect  Data Reviewed: Imaging Chest x-ray 08/09/2017- clear lungs Chest x-ray 07/04/2017- no active cardiopulmonary disease. I have reviewed the images personally  PFTs 10/03/2017 FVC 3.14 [93%), FEV1 2.79 [99%], F/F 89, TLC 96%, DLCO 81% Minimal obstructive airway disease.  FENO 09/29/2017-61  Labs CBC 09/29/2017-WBC 7.7, eos 14.4%, absolute eosinophil count 1100 Blood allergy profile 09/29/2017- IgE 3328, RAST panel shows allergies to dust mite, cat, dog, grass, mold, tree pollen  Assessment:  Moderate persistent asthma, T2 high Symptoms have improved with Breo She is on prednisone now for her arthritis, rheumatologic issues but has not required it for asthma  She has stable symptoms.  We will continue to observe her.  If there is any worsening then we can consider biologics and referral back to allergy  Plan/Recommendations: - Continue Breo, albuterol as needed  Chilton Greathouse MD [facility] 02/01/2018, 3:27 PM  CC: Lewis Moccasin, MD   Outpatient Encounter Medications as of 02/01/2018  Medication Sig  . albuterol (PROVENTIL HFA;VENTOLIN HFA) 108 (  90 Base) MCG/ACT inhaler Inhale 1-2 puffs into the lungs every 6 (six) hours as needed for wheezing or shortness of breath.  . cetirizine (ZYRTEC) 10 MG tablet Take 10 mg by mouth daily.  . fluticasone furoate-vilanterol (BREO ELLIPTA) 200-25 MCG/INH AEPB Inhale 1 puff into the lungs daily.  . folic acid (FOLVITE) 1 MG tablet   . Hydroxychloroquine Sulfate (PLAQUENIL PO) Take by mouth.  . methotrexate (RHEUMATREX) 2.5 MG tablet   . montelukast (SINGULAIR) 10 MG  tablet   . predniSONE (DELTASONE) 10 MG tablet 4 tabs x 3 days, 3 tabs x 3 days, 2 tabs x 3 days, 1 tabs x 3 days then stop   No facility-administered encounter medications on file as of 02/01/2018.     Allergies as of 02/01/2018 - Review Complete 02/01/2018  Allergen Reaction Noted  . Ibuprofen Hives 09/29/2017  . Penicillins Itching 09/29/2017  . Xanax [alprazolam] Itching 09/29/2017    Past Medical History:  Diagnosis Date  . Immune deficiency disorder [facility](HCC)     Past Surgical History:  Procedure Laterality Date  . ANKLE SURGERY      Family History  Problem Relation Age of Onset  . Hypertension Father   . Diabetes Maternal Grandmother   . Diabetes Paternal Grandmother   . Cancer Paternal Grandmother     Social History   Socioeconomic History  . Marital status: Single    Spouse name: Not on file  . Number of children: Not on file  . Years of education: Not on file  . Highest education level: Not on file  Occupational History  . Not on file  Social Needs  . Financial resource strain: Not on file  . Food insecurity:    Worry: Not on file    Inability: Not on file  . Transportation needs:    Medical: Not on file    Non-medical: Not on file  Tobacco Use  . Smoking status: Never Smoker  . Smokeless tobacco: Never Used  Substance and Sexual Activity  . Alcohol use: No  . Drug use: Never  . Sexual activity: Never  Lifestyle  . Physical activity:    Days per week: Not on file    Minutes per session: Not on file  . Stress: Not on file  Relationships  . Social connections:    Talks on phone: Not on file    Gets together: Not on file    Attends religious service: Not on file    Active member of club or organization: Not on file    Attends meetings of clubs or organizations: Not on file    Relationship status: Not on file  . Intimate partner violence:    Fear of current or ex partner: Not on file    Emotionally abused: Not on file    Physically abused: Not  on file    Forced sexual activity: Not on file  Other Topics Concern  . Not on file  Social History Narrative  . Not on file   Review of systems: Review of Systems  Constitutional: Negative for fever and chills.  HENT: Negative.   Eyes: Negative for blurred vision.  Respiratory: as per HPI  Cardiovascular: Negative for chest pain and palpitations.  Gastrointestinal: Negative for vomiting, diarrhea, blood per rectum. Genitourinary: Negative for dysuria, urgency, frequency and hematuria.  Musculoskeletal: Negative for myalgias, back pain and joint pain.  Skin: Negative for itching and rash.  Neurological: Negative for dizziness, tremors, focal weakness, seizures and loss of consciousness.  Endo/Heme/Allergies: Negative for environmental allergies.  Psychiatric/Behavioral: Negative for depression, suicidal ideas and hallucinations.  All other systems reviewed and are negative.  Chilton Greathouse MD [facility] 02/01/2018, 3:46 PM

## 2018-04-09 DIAGNOSIS — H02849 Edema of unspecified eye, unspecified eyelid: Secondary | ICD-10-CM | POA: Diagnosis not present

## 2018-04-09 DIAGNOSIS — D8989 Other specified disorders involving the immune mechanism, not elsewhere classified: Secondary | ICD-10-CM | POA: Diagnosis not present

## 2018-04-09 DIAGNOSIS — R590 Localized enlarged lymph nodes: Secondary | ICD-10-CM | POA: Diagnosis not present

## 2018-04-09 DIAGNOSIS — D721 Eosinophilia: Secondary | ICD-10-CM | POA: Diagnosis not present

## 2018-05-05 DIAGNOSIS — H5213 Myopia, bilateral: Secondary | ICD-10-CM | POA: Diagnosis not present

## 2018-07-02 DIAGNOSIS — R040 Epistaxis: Secondary | ICD-10-CM | POA: Diagnosis not present

## 2018-07-02 DIAGNOSIS — J301 Allergic rhinitis due to pollen: Secondary | ICD-10-CM | POA: Diagnosis not present

## 2018-07-02 DIAGNOSIS — Z6835 Body mass index (BMI) 35.0-35.9, adult: Secondary | ICD-10-CM | POA: Diagnosis not present

## 2018-07-02 DIAGNOSIS — J069 Acute upper respiratory infection, unspecified: Secondary | ICD-10-CM | POA: Diagnosis not present

## 2018-07-18 DIAGNOSIS — R590 Localized enlarged lymph nodes: Secondary | ICD-10-CM | POA: Diagnosis not present

## 2018-07-18 DIAGNOSIS — D8989 Other specified disorders involving the immune mechanism, not elsewhere classified: Secondary | ICD-10-CM | POA: Diagnosis not present

## 2018-07-18 DIAGNOSIS — H02849 Edema of unspecified eye, unspecified eyelid: Secondary | ICD-10-CM | POA: Diagnosis not present

## 2018-07-18 DIAGNOSIS — D721 Eosinophilia: Secondary | ICD-10-CM | POA: Diagnosis not present

## 2018-09-28 ENCOUNTER — Telehealth: Payer: Self-pay | Admitting: Pulmonary Disease

## 2018-09-28 NOTE — Telephone Encounter (Signed)
Called and spoke with pt. 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 I stated to her that anyone who was exposed to the resident if they had COVID would have to be quarantined so they could closely monitor their symptoms.   Pt mentioned to me that she wanted to know what needed to be done if she decided to take some time off of work due to her having autoimmune disease as well as asthma and I stated to pt if she wanted to talk it over with her supervisor in case she wanted to take some time off to see if that would be okay and also stated to her that we could see if we could get a letter written for her to give to her supervisor and pt expressed understanding. Pt stated they are currently wearing masks and checking temps every day but she wanted to know this info in case she wanted to do anything. Pt stated she would call is if she wanted to pursue taking time off of work. Nothing further needed.

## 2018-10-16 DIAGNOSIS — R590 Localized enlarged lymph nodes: Secondary | ICD-10-CM | POA: Diagnosis not present

## 2018-10-16 DIAGNOSIS — D721 Eosinophilia: Secondary | ICD-10-CM | POA: Diagnosis not present

## 2018-10-16 DIAGNOSIS — H02849 Edema of unspecified eye, unspecified eyelid: Secondary | ICD-10-CM | POA: Diagnosis not present

## 2018-10-16 DIAGNOSIS — D8989 Other specified disorders involving the immune mechanism, not elsewhere classified: Secondary | ICD-10-CM | POA: Diagnosis not present

## 2018-10-18 DIAGNOSIS — D8989 Other specified disorders involving the immune mechanism, not elsewhere classified: Secondary | ICD-10-CM | POA: Diagnosis not present

## 2018-11-17 ENCOUNTER — Other Ambulatory Visit: Payer: Self-pay | Admitting: Pulmonary Disease

## 2018-12-03 ENCOUNTER — Telehealth: Payer: Self-pay | Admitting: Pulmonary Disease

## 2018-12-03 MED ORDER — BREO ELLIPTA 200-25 MCG/INH IN AEPB: 1.0000 | INHALATION_SPRAY | Freq: Every day | RESPIRATORY_TRACT | 11 refills | Status: DC

## 2018-12-03 MED ORDER — ALBUTEROL SULFATE HFA 108 (90 BASE) MCG/ACT IN AERS: 1.0000 | INHALATION_SPRAY | Freq: Four times a day (QID) | RESPIRATORY_TRACT | 3 refills | Status: DC | PRN

## 2018-12-03 NOTE — Telephone Encounter (Signed)
Called and spoke with pt letting her know that I was going to send refill to pharmacy of both meds but also stated to her that she was due for appt per last OV with Dr. Vaughan Browner. Pt verbalized understanding.  Verified pt's preferred pharmacy and have sent meds to pharmacy for pt and have also scheduled follow up appt for pt with Dr. Michael Litter. Nothing further needed.

## 2018-12-11 ENCOUNTER — Encounter: Payer: Self-pay | Admitting: Pulmonary Disease

## 2018-12-11 ENCOUNTER — Ambulatory Visit: Payer: BC Managed Care – PPO | Admitting: Pulmonary Disease

## 2018-12-11 ENCOUNTER — Other Ambulatory Visit: Payer: Self-pay

## 2018-12-11 VITALS — BP 122/80 | HR 80 | Temp 98.9°F | Ht 67.0 in | Wt 238.0 lb

## 2018-12-11 DIAGNOSIS — J4540 Moderate persistent asthma, uncomplicated: Secondary | ICD-10-CM

## 2018-12-11 NOTE — Progress Notes (Addendum)
Brittney Bass    [id number]    1980-08-06  Primary Care Physician:Dewey, Christell ConstantElizabeth R, MD  Referring Physician: Lewis Moccasinewey, Elizabeth R, MD [address] [place] [id number]  Chief complaint: Follow up for mod persistent asthma, allergies  HPI: 38 year old with history of connective tissue disease, possible Sjogren's.  Comes with complains of cough since December 2019 Initially this was thought to be secondary to GERD.  She has given omeprazole or Nexium with no improvement of symptoms.  She is also seen ENT who diagnosed her with acute sinusitis and was treated with doxycycline x2 and a Z-Pak She had been to the ED in the last 3 months and given prednisone and albuterol inhaler which with only thinks that helped with her breathing.  However symptoms worsen after the prednisone was stopped.  She is being followed by Dr. Dierdre ForthBeekman for connective tissue disease, likely Sjogren's.  This was diagnosed around 2017 when she presented with eyelid swelling. Treated with Plaquenil. She had allergy testing several years ago and was told she was sensitive to mold, dust, grass, trees.  Pets: She has a dog. 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  She uses a humidifier at home.  She has a bird feeder in the backyard and recently joined work in a new facility with 2 indoor birds Smoking history: Never smoker Travel history: Not significant  ACQ-7 12/11/2018-0.57  Interim history: Continues on Breo inhaler with no issues.  States that breathing is doing well Follows with Dr. Dierdre ForthBeekman for connective tissue disease.  She is currently on methotrexate and on prednisone at 10 mg for her joint pain.  Outpatient Encounter Medications as of 12/11/2018  Medication Sig  . albuterol (VENTOLIN HFA) 108 (90 Base) MCG/ACT inhaler Inhale 1-2 puffs into the lungs every 6 (six) hours as needed for wheezing or shortness of breath.  . cetirizine (ZYRTEC) 10 MG tablet Take 10 mg by mouth  daily.  . fluticasone furoate-vilanterol (BREO ELLIPTA) 200-25 MCG/INH AEPB Inhale 1 puff into the lungs daily.  . folic acid (FOLVITE) 1 MG tablet   . Hydroxychloroquine Sulfate (PLAQUENIL PO) Take by mouth.  . methotrexate (RHEUMATREX) 2.5 MG tablet   . predniSONE (DELTASONE) 10 MG tablet 4 tabs x 3 days, 3 tabs x 3 days, 2 tabs x 3 days, 1 tabs x 3 days then stop  . montelukast (SINGULAIR) 10 MG tablet    No facility-administered encounter medications on file as of 12/11/2018.    Physical Exam: Blood pressure 122/80, pulse 80, temperature 98.9 F (37.2 C), temperature source Oral, height 5\' 7"  (1.702 m), weight 238 lb (108 kg), SpO2 98 %. Gen:      No acute distress HEENT:  EOMI, sclera anicteric Neck:     No masses; no thyromegaly Lungs:    Clear to auscultation bilaterally; normal respiratory effort CV:         Regular rate and rhythm; no murmurs Abd:      + bowel sounds; soft, non-tender; no palpable masses, no distension Ext:    No edema; adequate peripheral perfusion Skin:      Warm and dry; no rash Neuro: alert and oriented x 3 Psych: normal mood and affect  Data Reviewed: Imaging Chest x-ray 08/09/2017- clear lungs Chest x-ray 07/04/2017- no active cardiopulmonary disease. I have reviewed the images personally  PFTs 10/03/2017 FVC 3.14 [93%), FEV1 2.79 [99%], F/F 89, TLC 96%, DLCO 81% Minimal obstructive airway disease.  FENO 09/29/2017-61  Labs CBC 09/29/2017-WBC 7.7, eos 14.4%, absolute eosinophil count 1100 Blood allergy profile 09/29/2017- IgE 3328, RAST panel shows allergies to dust mite, cat, dog, grass, mold, tree pollen  Assessment:  Moderate persistent asthma, T2 high Symptoms have improved with Breo She is on prednisone now for her arthritis, rheumatologic issues but has not required it for asthma  She has stable symptoms.  We will continue to observe her.  If there is any worsening then we can consider biologics and referral back to allergy   Plan/Recommendations: - Continue Breo, albuterol as needed  Marshell Garfinkel MD [facility] 02/01/2018, 3:27 PM  CC: Fanny Bien, MD   Addendum: Received outside records from [facility]ology, Dr. Amil Amen dated 03/07/2019- Evaluated for abnormal ANA 1:320, eyelid swelling, cervical lymphadenopathy, dry mouth and dry She is being treated for CTD, possibly Sjogren's versus versus SLE or UCTD  Started methotrexate July 2019 for steroid responsive inflammatory arthritis.  Also on Plaquenil and chronic prednisone. Will attempt tapering prednisone again.  If unable to taper prednisone then consider RA biologic such as Rituxan. Also has epistaxis, possible related to autoimmune process.  Consider ENT evaluation.  Marshell Garfinkel MD [facility] 03/14/2019, 2:18 PM

## 2018-12-11 NOTE — Patient Instructions (Signed)
Glad you are doing well with regard to the breathing Continue with the Breo inhaler and Ventolin as needed Follow-up in 6 months

## 2018-12-31 DIAGNOSIS — B342 Coronavirus infection, unspecified: Secondary | ICD-10-CM | POA: Diagnosis not present

## 2019-01-16 DIAGNOSIS — Z0000 Encounter for general adult medical examination without abnormal findings: Secondary | ICD-10-CM | POA: Diagnosis not present

## 2019-01-17 DIAGNOSIS — Z0000 Encounter for general adult medical examination without abnormal findings: Secondary | ICD-10-CM | POA: Diagnosis not present

## 2019-03-07 DIAGNOSIS — H02849 Edema of unspecified eye, unspecified eyelid: Secondary | ICD-10-CM | POA: Diagnosis not present

## 2019-03-07 DIAGNOSIS — D7219 Other eosinophilia: Secondary | ICD-10-CM | POA: Diagnosis not present

## 2019-03-07 DIAGNOSIS — D8989 Other specified disorders involving the immune mechanism, not elsewhere classified: Secondary | ICD-10-CM | POA: Diagnosis not present

## 2019-03-07 DIAGNOSIS — R590 Localized enlarged lymph nodes: Secondary | ICD-10-CM | POA: Diagnosis not present

## 2019-05-07 DIAGNOSIS — I214 Non-ST elevation (NSTEMI) myocardial infarction: Secondary | ICD-10-CM | POA: Diagnosis not present

## 2019-05-07 DIAGNOSIS — E039 Hypothyroidism, unspecified: Secondary | ICD-10-CM | POA: Diagnosis not present

## 2019-05-07 DIAGNOSIS — R945 Abnormal results of liver function studies: Secondary | ICD-10-CM | POA: Diagnosis not present

## 2019-05-07 DIAGNOSIS — E0580 Other thyrotoxicosis without thyrotoxic crisis or storm: Secondary | ICD-10-CM | POA: Diagnosis not present

## 2019-05-07 DIAGNOSIS — I2510 Atherosclerotic heart disease of native coronary artery without angina pectoris: Secondary | ICD-10-CM | POA: Diagnosis not present

## 2019-05-07 DIAGNOSIS — Z3202 Encounter for pregnancy test, result negative: Secondary | ICD-10-CM | POA: Diagnosis not present

## 2019-05-07 DIAGNOSIS — E0590 Thyrotoxicosis, unspecified without thyrotoxic crisis or storm: Secondary | ICD-10-CM | POA: Diagnosis not present

## 2019-05-07 DIAGNOSIS — J4541 Moderate persistent asthma with (acute) exacerbation: Secondary | ICD-10-CM | POA: Diagnosis not present

## 2019-05-07 DIAGNOSIS — R079 Chest pain, unspecified: Secondary | ICD-10-CM | POA: Diagnosis not present

## 2019-05-07 DIAGNOSIS — D8989 Other specified disorders involving the immune mechanism, not elsewhere classified: Secondary | ICD-10-CM | POA: Diagnosis not present

## 2019-05-07 DIAGNOSIS — I25118 Atherosclerotic heart disease of native coronary artery with other forms of angina pectoris: Secondary | ICD-10-CM | POA: Diagnosis not present

## 2019-05-07 DIAGNOSIS — R748 Abnormal levels of other serum enzymes: Secondary | ICD-10-CM | POA: Diagnosis not present

## 2019-05-07 DIAGNOSIS — I501 Left ventricular failure: Secondary | ICD-10-CM | POA: Diagnosis not present

## 2019-05-07 DIAGNOSIS — J4540 Moderate persistent asthma, uncomplicated: Secondary | ICD-10-CM | POA: Diagnosis not present

## 2019-05-07 DIAGNOSIS — J45909 Unspecified asthma, uncomplicated: Secondary | ICD-10-CM | POA: Diagnosis not present

## 2019-05-07 DIAGNOSIS — I493 Ventricular premature depolarization: Secondary | ICD-10-CM | POA: Diagnosis not present

## 2019-05-07 DIAGNOSIS — R0600 Dyspnea, unspecified: Secondary | ICD-10-CM | POA: Diagnosis not present

## 2019-05-07 DIAGNOSIS — D649 Anemia, unspecified: Secondary | ICD-10-CM | POA: Diagnosis not present

## 2019-05-07 DIAGNOSIS — Z91041 Radiographic dye allergy status: Secondary | ICD-10-CM | POA: Diagnosis not present

## 2019-05-07 DIAGNOSIS — R0789 Other chest pain: Secondary | ICD-10-CM | POA: Diagnosis not present

## 2019-05-07 DIAGNOSIS — M3500 Sicca syndrome, unspecified: Secondary | ICD-10-CM | POA: Diagnosis not present

## 2019-05-07 DIAGNOSIS — I2582 Chronic total occlusion of coronary artery: Secondary | ICD-10-CM | POA: Diagnosis not present

## 2019-05-07 DIAGNOSIS — I517 Cardiomegaly: Secondary | ICD-10-CM | POA: Diagnosis not present

## 2019-05-07 DIAGNOSIS — R0602 Shortness of breath: Secondary | ICD-10-CM | POA: Diagnosis not present

## 2019-05-07 DIAGNOSIS — R9431 Abnormal electrocardiogram [ECG] [EKG]: Secondary | ICD-10-CM | POA: Diagnosis not present

## 2019-05-07 DIAGNOSIS — E786 Lipoprotein deficiency: Secondary | ICD-10-CM | POA: Diagnosis not present

## 2019-05-07 DIAGNOSIS — R946 Abnormal results of thyroid function studies: Secondary | ICD-10-CM | POA: Diagnosis not present

## 2019-05-07 DIAGNOSIS — R7989 Other specified abnormal findings of blood chemistry: Secondary | ICD-10-CM | POA: Diagnosis not present

## 2019-05-07 DIAGNOSIS — L509 Urticaria, unspecified: Secondary | ICD-10-CM | POA: Diagnosis not present

## 2019-05-07 DIAGNOSIS — I2699 Other pulmonary embolism without acute cor pulmonale: Secondary | ICD-10-CM | POA: Diagnosis not present

## 2019-05-07 DIAGNOSIS — Z8249 Family history of ischemic heart disease and other diseases of the circulatory system: Secondary | ICD-10-CM | POA: Diagnosis not present

## 2019-05-07 DIAGNOSIS — E873 Alkalosis: Secondary | ICD-10-CM | POA: Diagnosis not present

## 2019-05-07 DIAGNOSIS — I249 Acute ischemic heart disease, unspecified: Secondary | ICD-10-CM | POA: Diagnosis not present

## 2019-05-07 DIAGNOSIS — E876 Hypokalemia: Secondary | ICD-10-CM | POA: Diagnosis not present

## 2019-05-07 DIAGNOSIS — R000 Tachycardia, unspecified: Secondary | ICD-10-CM | POA: Diagnosis not present

## 2019-06-10 DIAGNOSIS — D8989 Other specified disorders involving the immune mechanism, not elsewhere classified: Secondary | ICD-10-CM | POA: Diagnosis not present

## 2019-06-10 DIAGNOSIS — H02849 Edema of unspecified eye, unspecified eyelid: Secondary | ICD-10-CM | POA: Diagnosis not present

## 2019-06-10 DIAGNOSIS — R5382 Chronic fatigue, unspecified: Secondary | ICD-10-CM | POA: Diagnosis not present

## 2019-06-10 DIAGNOSIS — R590 Localized enlarged lymph nodes: Secondary | ICD-10-CM | POA: Diagnosis not present

## 2019-06-12 DIAGNOSIS — Z713 Dietary counseling and surveillance: Secondary | ICD-10-CM | POA: Diagnosis not present

## 2019-06-14 DIAGNOSIS — I2510 Atherosclerotic heart disease of native coronary artery without angina pectoris: Secondary | ICD-10-CM | POA: Diagnosis not present

## 2019-06-14 DIAGNOSIS — I214 Non-ST elevation (NSTEMI) myocardial infarction: Secondary | ICD-10-CM | POA: Diagnosis not present

## 2019-06-28 DIAGNOSIS — Z1152 Encounter for screening for COVID-19: Secondary | ICD-10-CM | POA: Diagnosis not present

## 2019-07-15 DIAGNOSIS — I2510 Atherosclerotic heart disease of native coronary artery without angina pectoris: Secondary | ICD-10-CM | POA: Diagnosis not present

## 2019-07-15 DIAGNOSIS — E0590 Thyrotoxicosis, unspecified without thyrotoxic crisis or storm: Secondary | ICD-10-CM | POA: Diagnosis not present

## 2019-07-15 DIAGNOSIS — E782 Mixed hyperlipidemia: Secondary | ICD-10-CM | POA: Diagnosis not present

## 2019-07-15 DIAGNOSIS — I10 Essential (primary) hypertension: Secondary | ICD-10-CM | POA: Diagnosis not present

## 2019-07-29 DIAGNOSIS — I2510 Atherosclerotic heart disease of native coronary artery without angina pectoris: Secondary | ICD-10-CM | POA: Diagnosis not present

## 2019-07-29 DIAGNOSIS — I240 Acute coronary thrombosis not resulting in myocardial infarction: Secondary | ICD-10-CM | POA: Diagnosis not present

## 2019-07-29 DIAGNOSIS — Z713 Dietary counseling and surveillance: Secondary | ICD-10-CM | POA: Diagnosis not present

## 2019-07-29 DIAGNOSIS — R040 Epistaxis: Secondary | ICD-10-CM | POA: Diagnosis not present

## 2019-07-29 DIAGNOSIS — E0590 Thyrotoxicosis, unspecified without thyrotoxic crisis or storm: Secondary | ICD-10-CM | POA: Diagnosis not present

## 2019-09-07 DIAGNOSIS — H5213 Myopia, bilateral: Secondary | ICD-10-CM | POA: Diagnosis not present

## 2019-09-09 DIAGNOSIS — R040 Epistaxis: Secondary | ICD-10-CM | POA: Diagnosis not present

## 2019-09-12 DIAGNOSIS — D8989 Other specified disorders involving the immune mechanism, not elsewhere classified: Secondary | ICD-10-CM | POA: Diagnosis not present

## 2019-09-12 DIAGNOSIS — H02849 Edema of unspecified eye, unspecified eyelid: Secondary | ICD-10-CM | POA: Diagnosis not present

## 2019-09-12 DIAGNOSIS — R590 Localized enlarged lymph nodes: Secondary | ICD-10-CM | POA: Diagnosis not present

## 2019-09-12 DIAGNOSIS — R5382 Chronic fatigue, unspecified: Secondary | ICD-10-CM | POA: Diagnosis not present

## 2019-09-12 DIAGNOSIS — E0590 Thyrotoxicosis, unspecified without thyrotoxic crisis or storm: Secondary | ICD-10-CM | POA: Diagnosis not present

## 2019-09-19 DIAGNOSIS — I10 Essential (primary) hypertension: Secondary | ICD-10-CM | POA: Diagnosis not present

## 2019-09-19 DIAGNOSIS — I2510 Atherosclerotic heart disease of native coronary artery without angina pectoris: Secondary | ICD-10-CM | POA: Diagnosis not present

## 2019-09-19 DIAGNOSIS — E782 Mixed hyperlipidemia: Secondary | ICD-10-CM | POA: Diagnosis not present

## 2019-09-19 DIAGNOSIS — E0590 Thyrotoxicosis, unspecified without thyrotoxic crisis or storm: Secondary | ICD-10-CM | POA: Diagnosis not present

## 2019-10-18 ENCOUNTER — Other Ambulatory Visit: Payer: Self-pay

## 2019-10-18 ENCOUNTER — Encounter: Payer: Self-pay | Admitting: Internal Medicine

## 2019-10-18 ENCOUNTER — Ambulatory Visit: Payer: BC Managed Care – PPO | Admitting: Internal Medicine

## 2019-10-18 VITALS — BP 110/68 | HR 101 | Temp 98.6°F | Ht 67.0 in | Wt 239.8 lb

## 2019-10-18 DIAGNOSIS — E0590 Thyrotoxicosis, unspecified without thyrotoxic crisis or storm: Secondary | ICD-10-CM | POA: Insufficient documentation

## 2019-10-18 DIAGNOSIS — E876 Hypokalemia: Secondary | ICD-10-CM | POA: Diagnosis not present

## 2019-10-18 LAB — CBC WITH DIFFERENTIAL/PLATELET
Basophils Absolute: 0.1 10*3/uL (ref 0.0–0.1)
Basophils Relative: 1 % (ref 0.0–3.0)
Eosinophils Absolute: 1 10*3/uL — ABNORMAL HIGH (ref 0.0–0.7)
Eosinophils Relative: 15.8 % — ABNORMAL HIGH (ref 0.0–5.0)
HCT: 31.8 % — ABNORMAL LOW (ref 36.0–46.0)
Hemoglobin: 10.4 g/dL — ABNORMAL LOW (ref 12.0–15.0)
Lymphocytes Relative: 11.8 % — ABNORMAL LOW (ref 12.0–46.0)
Lymphs Abs: 0.8 10*3/uL (ref 0.7–4.0)
MCHC: 32.8 g/dL (ref 30.0–36.0)
MCV: 93.8 fl (ref 78.0–100.0)
Monocytes Absolute: 0.5 10*3/uL (ref 0.1–1.0)
Monocytes Relative: 8 % (ref 3.0–12.0)
Neutro Abs: 4.4 10*3/uL (ref 1.4–7.7)
Neutrophils Relative %: 64 % (ref 43.0–77.0)
Platelets: 333 10*3/uL (ref 150.0–400.0)
RBC: 3.39 Mil/uL — ABNORMAL LOW (ref 3.87–5.11)
RDW: 13.8 % (ref 11.5–15.5)
WBC: 6.8 10*3/uL (ref 4.0–10.5)

## 2019-10-18 LAB — COMPREHENSIVE METABOLIC PANEL
ALT: 9 U/L (ref 0–35)
AST: 13 U/L (ref 0–37)
Albumin: 3.3 g/dL — ABNORMAL LOW (ref 3.5–5.2)
Alkaline Phosphatase: 47 U/L (ref 39–117)
BUN: 7 mg/dL (ref 6–23)
CO2: 29 mEq/L (ref 19–32)
Calcium: 8.1 mg/dL — ABNORMAL LOW (ref 8.4–10.5)
Chloride: 105 mEq/L (ref 96–112)
Creatinine, Ser: 0.8 mg/dL (ref 0.40–1.20)
GFR: 97.15 mL/min (ref >60.00)
Glucose, Bld: 92 mg/dL (ref 70–99)
Potassium: 3 mEq/L — ABNORMAL LOW (ref 3.5–5.1)
Sodium: 137 mEq/L (ref 135–145)
Total Bilirubin: 0.4 mg/dL (ref 0.2–1.2)
Total Protein: 6.3 g/dL (ref 6.0–8.3)

## 2019-10-18 LAB — TSH: TSH: 28.16 u[IU]/mL — ABNORMAL HIGH (ref 0.35–4.50)

## 2019-10-18 LAB — T4, FREE: Free T4: 0.32 ng/dL — ABNORMAL LOW (ref 0.60–1.60)

## 2019-10-18 MED ORDER — METHIMAZOLE 5 MG PO TABS: 10.0000 mg | ORAL_TABLET | Freq: Every day | ORAL | 6 refills | Status: DC

## 2019-10-18 MED ORDER — POTASSIUM CHLORIDE ER 10 MEQ PO TBCR: 10.0000 meq | EXTENDED_RELEASE_TABLET | Freq: Every day | ORAL | 1 refills | Status: DC

## 2019-10-18 NOTE — Patient Instructions (Signed)
We recommend that you follow these hyperthyroidism instructions at home:  1) Take Methimazole 5 mg Three tablets  a day  If you develop severe sore throat with high fevers OR develop unexplained yellowing of your skin, eyes, under your tongue, severe abdominal pain with nausea or vomiting --> then please get evaluated immediately.

## 2019-10-18 NOTE — Progress Notes (Signed)
Name: Brittney Bass  MRN/ DOB: [id number], 20-Aug-1980    Age/ Sex: 39 y.o., female    PCP: Lewis Moccasin, MD   Reason for Endocrinology Evaluation:      Date of Initial Endocrinology Evaluation: 10/18/2019     HPI: [name] is a 39 y.o. female with a past medical history of HTN, dyslipidemia, CAD and Sjogren's syndrome . The patient presented for initial endocrinology clinic visit on 10/18/2019 for consultative assistance with her Hyperthyroidism    Pt presented to urgent care in 05/2019 for acute dyspnea, she was tachycardiac at that time, PE was ruled out but was noted to have a suppressed TSH . She was started on Methimazole at the time   Pt with abnormal cardiac cath 05/2019 - awaiting on stent placement but continues to have nasal bleed   Currently on Methimazole 5 mg TID   Today her weight has been noted to be stable  Denies palpitations  Denies diarrhea    She does have occasional abdominal growling followed by loose stools, alternating with constipation  Denies radiation exposure    She has noted right sided neck swelling around 05/2019, has been stable in size, no pain  No biotin intake   Works at [employer] ( retirement community )  No FH of thyroid disease     HISTORY:  Past Medical History:  Past Medical History:  Diagnosis Date  . Coronary artery disease   . Dyslipidemia   . GERD (gastroesophageal reflux disease)   . Hypertension   . Immune deficiency disorder [facility])    Past Surgical History:  Past Surgical History:  Procedure Laterality Date  . ANKLE SURGERY      Social History:  reports that she has never smoked. She has never used smokeless tobacco. She reports that she does not drink alcohol or use drugs. Family History: family history includes Cancer in her paternal grandmother; Diabetes in her maternal grandmother and paternal grandmother; Hypertension in her father.   HOME MEDICATIONS: Allergies as of 10/18/2019       Reactions   Ibuprofen Hives   Penicillins Itching   Xanax [alprazolam] Itching      Medication List       Accurate as of Oct 18, 2019 11:33 AM. If you have any questions, ask your nurse or doctor.        albuterol 108 (90 Base) MCG/ACT inhaler Commonly known as: VENTOLIN HFA Inhale 1-2 puffs into the lungs every 6 (six) hours as needed for wheezing or shortness of breath.   Aspirin Low Dose 81 MG EC tablet Generic drug: aspirin Take 81 mg by mouth daily.   atorvastatin 40 MG tablet Commonly known as: LIPITOR Take 40 mg by mouth daily.   Breo Ellipta 200-25 MCG/INH Aepb Generic drug: fluticasone furoate-vilanterol Inhale 1 puff into the lungs daily.   cetirizine 10 MG tablet Commonly known as: ZYRTEC Take 10 mg by mouth daily.   folic acid 1 MG tablet Commonly known as: FOLVITE   lisinopril 5 MG tablet Commonly known as: ZESTRIL Take 5 mg by mouth daily.   methimazole 5 MG tablet Commonly known as: TAPAZOLE   methotrexate 2.5 MG tablet Commonly known as: RHEUMATREX   metoprolol tartrate 50 MG tablet Commonly known as: LOPRESSOR Take 50 mg by mouth 2 (two) times daily.   montelukast 10 MG tablet Commonly known as: SINGULAIR   nitroGLYCERIN 0.4 MG SL tablet Commonly known as: NITROSTAT PLEASE SEE ATTACHED FOR DETAILED  DIRECTIONS   PLAQUENIL PO Take by mouth.   predniSONE 10 MG tablet Commonly known as: DELTASONE 4 tabs x 3 days, 3 tabs x 3 days, 2 tabs x 3 days, 1 tabs x 3 days then stop What changed: how much to take         REVIEW OF SYSTEMS: A comprehensive ROS was conducted with the patient and is negative except as per HPI       OBJECTIVE:  VS: BP 110/68 (BP Location: Left Arm, Patient Position: Sitting, Cuff Size: Large)   Pulse (!) 101   Temp 98.6 F (37 C)   Ht 5\' 7"  (1.702 m)   Wt 239 lb 12.8 oz (108.8 kg)   SpO2 98%   BMI 37.56 kg/m    Wt Readings from Last 3 Encounters:  10/18/19 239 lb 12.8 oz (108.8 kg)  12/11/18 238 lb  (108 kg)  02/01/18 225 lb (102.1 kg)     EXAM: General: Pt appears well and is in NAD  Eyes: External eye exam normal without stare, lid lag or exophthalmos.  EOM intact.  Neck: General: Supple without adenopathy. Thyroid: Thyroid size is enlarged ~ 80 grams.  No nodules appreciated.   Lungs: Clear with good BS bilat with no rales, rhonchi, or wheezes  Heart: Auscultation: RRR.  Abdomen: Normoactive bowel sounds, soft, nontender, without masses or organomegaly palpable  Extremities:  BL LE: No pretibial edema normal ROM and strength.  Skin: Hair: Texture and amount normal with gender appropriate distribution Skin Inspection: No rashes, + acanthosis nigricans around the neck Skin Palpation: Skin temperature, texture, and thickness normal to palpation  Neuro: Cranial nerves: II - XII grossly intact  Motor: Normal strength throughout DTRs: 2+ and symmetric in UE without delay in relaxation phase  Mental Status: Judgment, insight: Intact Orientation: Oriented to time, place, and person Mood and affect: No depression, anxiety, or agitation     DATA REVIEWED: 09/12/2019  WBC 7.1 H/H 10.6/33.4 Plt 394 BUN/Cr 7/0.85 GFR 101 Alk phos 61 AST 12 ALT 10   Results for Brittney Bass, Brittney Bass (MRN [id number]) as of 10/18/2019 16:35  Ref. Range 10/18/2019 12:13  Sodium Latest Ref Range: 135 - 145 mEq/L 137  Potassium Latest Ref Range: 3.5 - 5.1 mEq/L 3.0 (L)  Chloride Latest Ref Range: 96 - 112 mEq/L 105  CO2 Latest Ref Range: 19 - 32 mEq/L 29  Glucose Latest Ref Range: 70 - 99 mg/dL 92  BUN Latest Ref Range: 6 - 23 mg/dL 7  Creatinine Latest Ref Range: 0.40 - 1.20 mg/dL 5.62  Calcium Latest Ref Range: 8.4 - 10.5 mg/dL 8.1 (L)  Alkaline Phosphatase Latest Ref Range: 39 - 117 U/L 47  Albumin Latest Ref Range: 3.5 - 5.2 g/dL 3.3 (L)  AST Latest Ref Range: 0 - 37 U/L 13  ALT Latest Ref Range: 0 - 35 U/L 9  Total Protein Latest Ref Range: 6.0 - 8.3 g/dL 6.3  Total Bilirubin Latest Ref Range:  0.2 - 1.2 mg/dL 0.4  GFR Latest Ref Range: >60.00 mL/min 97.15  WBC Latest Ref Range: 4.0 - 10.5 K/uL 6.8  RBC Latest Ref Range: 3.87 - 5.11 Mil/uL 3.39 (L)  Hemoglobin Latest Ref Range: 12.0 - 15.0 g/dL 13.0 (L)  HCT Latest Ref Range: 36.0 - 46.0 % 31.8 (L)  MCV Latest Ref Range: 78.0 - 100.0 fl 93.8  MCHC Latest Ref Range: 30.0 - 36.0 g/dL 86.5  RDW Latest Ref Range: 11.5 - 15.5 % 13.8  Platelets Latest Ref  Range: 150.0 - 400.0 K/uL 333.0  Neutrophils Latest Ref Range: 43.0 - 77.0 % 64.0  Lymphocytes Latest Ref Range: 12.0 - 46.0 % 11.8 (L)  Monocytes Relative Latest Ref Range: 3.0 - 12.0 % 8.0  Eosinophil Latest Ref Range: 0.0 - 5.0 % 15.8 (H)  Basophil Latest Ref Range: 0.0 - 3.0 % 1.0  NEUT# Latest Ref Range: 1.4 - 7.7 K/uL 4.4  Lymphocyte # Latest Ref Range: 0.7 - 4.0 K/uL 0.8  Monocyte # Latest Ref Range: 0.1 - 1.0 K/uL 0.5  Eosinophils Absolute Latest Ref Range: 0.0 - 0.7 K/uL 1.0 (H)  Basophils Absolute Latest Ref Range: 0.0 - 0.1 K/uL 0.1  TSH Latest Ref Range: 0.35 - 4.50 uIU/mL 28.16 (H)  T4,Free(Direct) Latest Ref Range: 0.60 - 1.60 ng/dL 4.09 (L)   ASSESSMENT/PLAN/RECOMMENDATIONS:   Hyperthyroidism:  - Pt is clinically euthyroid  - pt endorses a localized right neck swelling, on exam today I could feel the whole gland enlarged but no localized nodule- will proceed with thyroid ultrasound  - We discussed D/D Graves' disease, vs toxic nodule(s) vs thyroiditis  - We discussed that Graves' Disease is a result of an autoimmune condition involving the thyroid.  - We discussed with pt the benefits of methimazole in the Tx of hyperthyroidism, as well as the possible side effects/complications of anti-thyroid drug Tx (specifically detailing the rare, but serious side effect of agranulocytosis). She was informed of need for regular thyroid function monitoring while on methimazole to ensure appropriate dosage without over-treatment. As well, we discussed the possible side effects of  methimazole including the chance of rash, the small chance of liver irritation/juandice and the <=1 in 300-400 chance of sudden onset agranulocytosis.  We discussed importance of going to ED promptly (and stopping methimazole) if shewere to develop significant fever with severe sore throat of other evidence of acute infection.     We extensively discussed the various treatment options for hyperthyroidism and Graves disease including ablation therapy with radioactive iodine versus antithyroid drug treatment versus surgical therapy.  We recommended to the patient that we felt, at this time, that thionamide therapy would be most optimal.  We discussed the various possible benefits versus side effects of the various therapies.   I carefully explained to the patient that one of the consequences of I-131 ablation treatment would likely be permanent hypothyroidism which would require long-term replacement therapy with LT4.   Medications : Decrease Methimazole 5 mg , two tablets daily     2. Hyokalemia:  - Unclear cause at this time, will investigate further next visit  - Will replenish with KCL 10 mEq daily x 15 -30 days    F/U in 3 months    Addendum: Left a message with the results and the instruction on 10/18/2019 at 1630  Signed electronically by: Lyndle Herrlich, MD  [facility] Endocrinology  [facility] [address] [place] [place], [place] [address] Phone: [phone] FAX: [phone]   CC: Lewis Moccasin, MD [address] [place] [address] Phone: [phone] Fax: [phone]   Return to Endocrinology clinic as below: No future appointments.

## 2019-10-21 LAB — TRAB (TSH RECEPTOR BINDING ANTIBODY): TRAB: 1.6 IU/L (ref <=2.00)

## 2019-10-28 DIAGNOSIS — Z713 Dietary counseling and surveillance: Secondary | ICD-10-CM | POA: Diagnosis not present

## 2019-11-11 ENCOUNTER — Other Ambulatory Visit: Payer: Self-pay | Admitting: Internal Medicine

## 2019-11-19 ENCOUNTER — Other Ambulatory Visit: Payer: BC Managed Care – PPO

## 2019-12-03 ENCOUNTER — Ambulatory Visit
Admission: RE | Admit: 2019-12-03 | Discharge: 2019-12-03 | Disposition: A | Discharge status: Home / Self Care | Payer: Self-pay | Source: Ambulatory Visit | Attending: Internal Medicine | Admitting: Internal Medicine

## 2019-12-03 DIAGNOSIS — E0590 Thyrotoxicosis, unspecified without thyrotoxic crisis or storm: Secondary | ICD-10-CM

## 2019-12-05 ENCOUNTER — Encounter: Payer: Self-pay | Admitting: Internal Medicine

## 2019-12-17 DIAGNOSIS — H02849 Edema of unspecified eye, unspecified eyelid: Secondary | ICD-10-CM | POA: Diagnosis not present

## 2019-12-17 DIAGNOSIS — R590 Localized enlarged lymph nodes: Secondary | ICD-10-CM | POA: Diagnosis not present

## 2019-12-17 DIAGNOSIS — R5382 Chronic fatigue, unspecified: Secondary | ICD-10-CM | POA: Diagnosis not present

## 2019-12-17 DIAGNOSIS — D8989 Other specified disorders involving the immune mechanism, not elsewhere classified: Secondary | ICD-10-CM | POA: Diagnosis not present

## 2020-01-10 ENCOUNTER — Ambulatory Visit: Payer: BC Managed Care – PPO | Admitting: Internal Medicine

## 2020-01-21 DIAGNOSIS — E782 Mixed hyperlipidemia: Secondary | ICD-10-CM | POA: Diagnosis not present

## 2020-01-21 DIAGNOSIS — I952 Hypotension due to drugs: Secondary | ICD-10-CM | POA: Diagnosis not present

## 2020-01-21 DIAGNOSIS — E1169 Type 2 diabetes mellitus with other specified complication: Secondary | ICD-10-CM | POA: Diagnosis not present

## 2020-01-27 ENCOUNTER — Ambulatory Visit: Payer: Self-pay | Admitting: Internal Medicine

## 2020-01-27 NOTE — Progress Notes (Deleted)
Name: Brittney Bass  MRN/ DOB: [id number], 11-13-1980    Age/ Sex: 39 y.o., female     PCP: Lewis Moccasin, MD   Reason for Endocrinology Evaluation: ***     Initial Endocrinology Clinic Visit: ***    PATIENT IDENTIFIER: Brittney Bass is a 39 y.o., female with a past medical history of ***. She has followed with [facility] since *** for consultative assistance with management of her ***.   HISTORICAL SUMMARY: The patient was first diagnosed with *** at ***, in the setting of ***. Since that time, ***.    SUBJECTIVE:   During last visit (***): ***  Today (01/27/2020):  [name] is here for ****   ROS:  As per HPI.   HISTORY:  Past Medical History:  Past Medical History:  Diagnosis Date  . Coronary artery disease   . Dyslipidemia   . GERD (gastroesophageal reflux disease)   . Hypertension   . Immune deficiency disorder [facility])     Past Surgical History:  Past Surgical History:  Procedure Laterality Date  . ANKLE SURGERY       Social History:  reports that she has never smoked. She has never used smokeless tobacco. She reports that she does not drink alcohol and does not use drugs. Family History:  Family History  Problem Relation Age of Onset  . Hypertension Father   . Diabetes Maternal Grandmother   . Diabetes Paternal Grandmother   . Cancer Paternal Grandmother       HOME MEDICATIONS: Allergies as of 01/27/2020      Reactions   Ibuprofen Hives   Penicillins Itching   Xanax [alprazolam] Itching      Medication List       Accurate as of January 27, 2020  1:11 PM. If you have any questions, ask your nurse or doctor.        albuterol 108 (90 Base) MCG/ACT inhaler Commonly known as: VENTOLIN HFA Inhale 1-2 puffs into the lungs every 6 (six) hours as needed for wheezing or shortness of breath.   Aspirin Low Dose 81 MG EC tablet Generic drug: aspirin Take 81 mg by mouth daily.   atorvastatin 40 MG tablet Commonly  known as: LIPITOR Take 40 mg by mouth daily.   Breo Ellipta 200-25 MCG/INH Aepb Generic drug: fluticasone furoate-vilanterol Inhale 1 puff into the lungs daily.   cetirizine 10 MG tablet Commonly known as: ZYRTEC Take 10 mg by mouth daily.   folic acid 1 MG tablet Commonly known as: FOLVITE   lisinopril 5 MG tablet Commonly known as: ZESTRIL Take 5 mg by mouth daily.   methimazole 5 MG tablet Commonly known as: TAPAZOLE Take 2 tablets (10 mg total) by mouth daily.   methotrexate 2.5 MG tablet Commonly known as: RHEUMATREX   metoprolol tartrate 50 MG tablet Commonly known as: LOPRESSOR Take 50 mg by mouth 2 (two) times daily.   montelukast 10 MG tablet Commonly known as: SINGULAIR   nitroGLYCERIN 0.4 MG SL tablet Commonly known as: NITROSTAT PLEASE SEE ATTACHED FOR DETAILED DIRECTIONS   PLAQUENIL PO Take by mouth.   potassium chloride 10 MEQ tablet Commonly known as: KLOR-CON TAKE 1 TABLET BY MOUTH EVERY DAY         OBJECTIVE:   PHYSICAL EXAM: VS: There were no vitals taken for this visit.   EXAM: General: Pt appears well and is in NAD  Hydration: Well-hydrated with moist mucous membranes and good skin turgor  Eyes:  External eye exam normal without stare, lid lag or exophthalmos.  EOM intact.  PERRL.  Ears, Nose, Throat: Hearing: Grossly intact bilaterally Dental: Good dentition  Throat: Clear without mass, erythema or exudate  Neck: General: Supple without adenopathy. Thyroid: Thyroid size normal.  No goiter or nodules appreciated. No thyroid bruit.  Lungs: Clear with good BS bilat with no rales, rhonchi, or wheezes  Heart: Auscultation: RRR.  Abdomen: Normoactive bowel sounds, soft, nontender, without masses or organomegaly palpable  Extremities: Gait and station: Normal gait  Digits and nails: No clubbing, cyanosis, petechiae, or nodes Head and neck: Normal alignment and mobility BL UE: Normal ROM and strength. BL LE: No pretibial edema normal  ROM and strength.  Skin: Hair: Texture and amount normal with gender appropriate distribution Skin Inspection: No rashes, acanthosis nigricans/skin tags. No lipohypertrophy Skin Palpation: Skin temperature, texture, and thickness normal to palpation  Neuro: Cranial nerves: II - XII grossly intact  Cerebellar: Normal coordination and movement; no tremor Motor: Normal strength throughout DTRs: 2+ and symmetric in UE without delay in relaxation phase  Mental Status: Judgment, insight: Intact Orientation: Oriented to time, place, and person Memory: Intact for recent and remote events Mood and affect: No depression, anxiety, or agitation     DATA REVIEWED: ***    ASSESSMENT / PLAN / RECOMMENDATIONS:   1. ***  Plan:  ***    Medications   ***   Signed electronically by: Lyndle Herrlich, MD  [facility] Endocrinology  [facility] [address] [place] [place], [place] [address] Phone: [phone] FAX: [phone]      CC: Lewis Moccasin, MD [address] [place] [address] Phone: [phone]  Fax: [phone]   Return to Endocrinology clinic as below: Future Appointments  Date Time Provider Department Center  01/27/2020  1:20 PM Pranathi Winfree, Konrad Dolores, MD [id number] None

## 2020-02-21 ENCOUNTER — Telehealth: Payer: Self-pay | Admitting: Pulmonary Disease

## 2020-02-21 MED ORDER — ALBUTEROL SULFATE HFA 108 (90 BASE) MCG/ACT IN AERS: 1.0000 | INHALATION_SPRAY | Freq: Four times a day (QID) | RESPIRATORY_TRACT | 0 refills | Status: DC | PRN

## 2020-02-21 NOTE — Telephone Encounter (Signed)
Called and spoke with pt. Stated to pt that we would send Rx to pharmacy for her inhaler but she needed to make an appt as she was overdue for an appt. Pt verbalized understanding. appt has been scheduled for pt and Rx has been sent to preferred pharmacy. Nothing further needed.

## 2020-03-12 ENCOUNTER — Encounter: Payer: Self-pay | Admitting: Pulmonary Disease

## 2020-03-12 ENCOUNTER — Ambulatory Visit: Payer: BC Managed Care – PPO | Admitting: Pulmonary Disease

## 2020-03-12 ENCOUNTER — Ambulatory Visit (INDEPENDENT_AMBULATORY_CARE_PROVIDER_SITE_OTHER): Payer: BC Managed Care – PPO

## 2020-03-12 ENCOUNTER — Other Ambulatory Visit: Payer: Self-pay

## 2020-03-12 VITALS — BP 104/68 | HR 96 | Temp 98.0°F | Ht 67.0 in | Wt 227.2 lb

## 2020-03-12 DIAGNOSIS — J4540 Moderate persistent asthma, uncomplicated: Secondary | ICD-10-CM

## 2020-03-12 DIAGNOSIS — R0602 Shortness of breath: Secondary | ICD-10-CM | POA: Diagnosis not present

## 2020-03-12 DIAGNOSIS — R0609 Other forms of dyspnea: Secondary | ICD-10-CM

## 2020-03-12 DIAGNOSIS — R0600 Dyspnea, unspecified: Secondary | ICD-10-CM

## 2020-03-12 NOTE — Progress Notes (Signed)
Brittney Bass    [id number]    1981-02-27  Primary Care Physician:Dewey, Christell Constant, MD  Referring Physician: Lewis Moccasin, MD [address] [place],  [place] [address]  Chief complaint: Follow up for mod persistent asthma, allergies  HPI: 39 year old with history of connective tissue disease, possible Sjogren's, GERD Being treated for dyspnea which is presumed to be secondary to asthma with Breo, albuterol  She is being followed by Dr. Dierdre Forth for connective tissue disease, likely Sjogren's with methotrexate, prednisone and Plaquenil She had allergy testing several years ago and was told she was sensitive to mold, dust, grass, trees.  Pets: She has a dog. Occupation: Child psychotherapist Exposures: No dampness, mold.  She uses a humidifier at home.  She has a bird feeder in the backyard and recently joined work in a new facility with 2 indoor birds Smoking history: Never smoker Travel history: Not significant  ACQ-7 12/11/2018-0.57  Interim history: Received outside records from [facility] rheumatology, Dr. Dierdre Forth dated 03/07/2019- Evaluated for abnormal ANA 1:320, eyelid swelling, cervical lymphadenopathy, dry mouth and dry She is being treated for CTD, possibly Sjogren's versus versus SLE or UCTD Started methotrexate July 2019 for steroid responsive inflammatory arthritis.  Also CTD was treated with Plaquenil and steroids  She has been weaned off steroids.  States her breathing is doing very well Stopped using the Breo inhaler and hardly needs to use her rescue inhaler  Recently diagnosed with hyperthyroidism and is on methimazole  Outpatient Encounter Medications as of 12/11/2018  Medication Sig  . albuterol (VENTOLIN HFA) 108 (90 Base) MCG/ACT inhaler Inhale 1-2 puffs into the lungs every 6 (six) hours as needed for wheezing or shortness of breath.  . cetirizine (ZYRTEC) 10 MG tablet Take 10 mg by mouth daily.  . fluticasone furoate-vilanterol (BREO  ELLIPTA) 200-25 MCG/INH AEPB Inhale 1 puff into the lungs daily.  . folic acid (FOLVITE) 1 MG tablet   . Hydroxychloroquine Sulfate (PLAQUENIL PO) Take by mouth.  . methotrexate (RHEUMATREX) 2.5 MG tablet   . predniSONE (DELTASONE) 10 MG tablet 4 tabs x 3 days, 3 tabs x 3 days, 2 tabs x 3 days, 1 tabs x 3 days then stop  . montelukast (SINGULAIR) 10 MG tablet    No facility-administered encounter medications on file as of 12/11/2018.    Physical Exam: Gen:      No acute distress HEENT:  EOMI, sclera anicteric Neck:     No masses; no thyromegaly Lungs:    Clear to auscultation bilaterally; normal respiratory effort CV:         Regular rate and rhythm; no murmurs Abd:      + bowel sounds; soft, non-tender; no palpable masses, no distension Ext:    No edema; adequate peripheral perfusion Skin:      Warm and dry; no rash Neuro: alert and oriented x 3 Psych: normal mood and affect  Data Reviewed: Imaging Chest x-ray 08/09/2017- clear lungs Chest x-ray 07/04/2017- no active cardiopulmonary disease. I have reviewed the images personally  PFTs 10/03/2017 FVC 3.14 [93%), FEV1 2.79 [99%], F/F 89, TLC 96%, DLCO 81% Minimal obstructive airway disease.  FENO 09/29/2017-61  Labs CBC 09/29/2017-WBC 7.7, eos 14.4%, absolute eosinophil count 1100 Blood allergy profile 09/29/2017- IgE 3328, RAST panel shows allergies to dust mite, cat, dog, grass, mold, tree pollen  Assessment:  Moderate persistent asthma, T2 high Currently off all inhalers and is just using albuterol as needed. \ She  has stable symptoms.  We will continue to observe her.  If there is any worsening then we can consider reinitiation of controller medication and referral back to allergy.  Sjogren's syndrome She is being treated for presumed Sjogren's syndrome by Dr. Dierdre Forth.  Weaned off prednisone and methotrexate is being weaned off as well.  As per the patient the diagnosis of connective tissue disease is being questioned as her  presentation of dyspnea, chest pain may have been secondary to undiagnosed hypothyroidism.  No evidence of CTD related interstitial lung disease on prior imaging and PFTs.  We will repeat chest x-ray today and PFTs in 6 months for monitoring.  She would like to avoid excessive testing and CT scan since she is doing well.  Plan/Recommendations: - Continue albuterol as needed - PFTs in 6 months.  Chilton Greathouse MD [facility] 03/12/2020, 9:45 AM

## 2020-03-12 NOTE — Patient Instructions (Signed)
I am glad you are doing well with regard to your breathing We will get a chest x-ray today Schedule PFTs in 6 months and follow-up in clinic after PFTs.

## 2020-03-13 ENCOUNTER — Encounter: Payer: Self-pay | Admitting: *Deleted

## 2020-03-24 DIAGNOSIS — Z713 Dietary counseling and surveillance: Secondary | ICD-10-CM | POA: Diagnosis not present

## 2020-04-03 ENCOUNTER — Other Ambulatory Visit: Payer: Self-pay | Admitting: Internal Medicine

## 2020-04-17 ENCOUNTER — Other Ambulatory Visit: Payer: Self-pay | Admitting: Internal Medicine

## 2020-05-14 DIAGNOSIS — Z0000 Encounter for general adult medical examination without abnormal findings: Secondary | ICD-10-CM | POA: Diagnosis not present

## 2020-05-14 DIAGNOSIS — E782 Mixed hyperlipidemia: Secondary | ICD-10-CM | POA: Diagnosis not present

## 2020-05-19 DIAGNOSIS — Z0000 Encounter for general adult medical examination without abnormal findings: Secondary | ICD-10-CM | POA: Diagnosis not present

## 2020-05-27 DIAGNOSIS — Z713 Dietary counseling and surveillance: Secondary | ICD-10-CM | POA: Diagnosis not present

## 2020-08-07 DIAGNOSIS — H02849 Edema of unspecified eye, unspecified eyelid: Secondary | ICD-10-CM | POA: Diagnosis not present

## 2020-08-07 DIAGNOSIS — R590 Localized enlarged lymph nodes: Secondary | ICD-10-CM | POA: Diagnosis not present

## 2020-08-07 DIAGNOSIS — D8989 Other specified disorders involving the immune mechanism, not elsewhere classified: Secondary | ICD-10-CM | POA: Diagnosis not present

## 2020-08-07 DIAGNOSIS — R5382 Chronic fatigue, unspecified: Secondary | ICD-10-CM | POA: Diagnosis not present

## 2020-08-14 DIAGNOSIS — Z713 Dietary counseling and surveillance: Secondary | ICD-10-CM | POA: Diagnosis not present

## 2020-08-27 ENCOUNTER — Telehealth: Payer: Self-pay | Admitting: Internal Medicine

## 2020-08-27 MED ORDER — METHIMAZOLE 5 MG PO TABS
15.0000 mg | ORAL_TABLET | Freq: Every day | ORAL | 2 refills | Status: DC
Start: 2020-08-27 — End: 2020-10-15

## 2020-08-27 NOTE — Telephone Encounter (Signed)
MEDICATION: Methimazole 5MG  - 3 pills per day  PHARMACY:  [facility] on [address]  HAS THE PATIENT CONTACTED THEIR PHARMACY?  yes  IS THIS A 90 DAY SUPPLY : yes  IS PATIENT OUT OF MEDICATION: 1-2 day  IF NOT; HOW MUCH IS LEFT:   LAST APPOINTMENT DATE: @11 /05/2020  NEXT APPOINTMENT DATE:@Visit  date not found  DO WE HAVE YOUR PERMISSION TO LEAVE A DETAILED MESSAGE?: yes  OTHER COMMENTS: patients states that her annual physical on 06/02/20 lab showed abnormal thyroid - PCP put her back to 3 (5 MG) per day instead of 2   **Let patient know to contact pharmacy at the end of the day to make sure medication is ready. **  ** Please notify patient to allow 48-72 hours to process**  **Encourage patient to contact the pharmacy for refills or they can request refills through [facility]**

## 2020-08-27 NOTE — Telephone Encounter (Signed)
Refill sent.

## 2020-09-03 ENCOUNTER — Other Ambulatory Visit: Payer: Self-pay | Admitting: Pulmonary Disease

## 2020-09-07 ENCOUNTER — Other Ambulatory Visit (HOSPITAL_COMMUNITY)
Admission: RE | Admit: 2020-09-07 | Discharge: 2020-09-07 | Disposition: A | Discharge status: Home / Self Care | Payer: BC Managed Care – PPO | Source: Ambulatory Visit | Attending: Pulmonary Disease | Admitting: Pulmonary Disease

## 2020-09-07 DIAGNOSIS — Z20822 Contact with and (suspected) exposure to covid-19: Secondary | ICD-10-CM | POA: Diagnosis not present

## 2020-09-07 DIAGNOSIS — Z01812 Encounter for preprocedural laboratory examination: Secondary | ICD-10-CM | POA: Diagnosis not present

## 2020-09-07 LAB — SARS CORONAVIRUS 2 (TAT 6-24 HRS): SARS Coronavirus 2: NEGATIVE

## 2020-09-10 ENCOUNTER — Ambulatory Visit (INDEPENDENT_AMBULATORY_CARE_PROVIDER_SITE_OTHER): Payer: BC Managed Care – PPO | Admitting: Pulmonary Disease

## 2020-09-10 ENCOUNTER — Other Ambulatory Visit: Payer: Self-pay

## 2020-09-10 DIAGNOSIS — R0600 Dyspnea, unspecified: Secondary | ICD-10-CM

## 2020-09-10 DIAGNOSIS — J4540 Moderate persistent asthma, uncomplicated: Secondary | ICD-10-CM | POA: Diagnosis not present

## 2020-09-10 DIAGNOSIS — R0609 Other forms of dyspnea: Secondary | ICD-10-CM

## 2020-09-10 NOTE — Progress Notes (Signed)
PFT done today. 

## 2020-09-25 DIAGNOSIS — H5213 Myopia, bilateral: Secondary | ICD-10-CM | POA: Diagnosis not present

## 2020-09-27 ENCOUNTER — Other Ambulatory Visit: Payer: Self-pay | Admitting: Internal Medicine

## 2020-09-29 LAB — PULMONARY FUNCTION TEST
DL/VA % pred: 119 %
DL/VA: 5.26 ml/min/mmHg/L
DLCO cor % pred: 105 %
DLCO cor: 24.79 ml/min/mmHg
DLCO unc % pred: 105 %
DLCO unc: 24.79 ml/min/mmHg
FEF 25-75 Post: 5.48 L/sec
FEF 25-75 Pre: 4.58 L/sec
FEF2575-%Change-Post: 19 %
FEF2575-%Pred-Post: 179 %
FEF2575-%Pred-Pre: 150 %
FEV1-%Change-Post: 6 %
FEV1-%Pred-Post: 120 %
FEV1-%Pred-Pre: 112 %
FEV1-Post: 3.32 L
FEV1-Pre: 3.12 L
FEV1FVC-%Change-Post: 1 %
FEV1FVC-%Pred-Pre: 104 %
FEV6-%Change-Post: 4 %
FEV6-%Pred-Post: 113 %
FEV6-%Pred-Pre: 108 %
FEV6-Post: 3.72 L
FEV6-Pre: 3.56 L
FEV6FVC-%Change-Post: 0 %
FEV6FVC-%Pred-Post: 101 %
FEV6FVC-%Pred-Pre: 101 %
FVC-%Change-Post: 4 %
FVC-%Pred-Post: 111 %
FVC-%Pred-Pre: 106 %
FVC-Post: 3.72 L
FVC-Pre: 3.57 L
Post FEV1/FVC ratio: 89 %
Post FEV6/FVC ratio: 100 %
Pre FEV1/FVC ratio: 87 %
Pre FEV6/FVC Ratio: 100 %
RV % pred: 95 %
RV: 1.58 L
TLC % pred: 91 %
TLC: 4.92 L

## 2020-10-14 ENCOUNTER — Encounter: Payer: Self-pay | Admitting: Internal Medicine

## 2020-10-14 ENCOUNTER — Other Ambulatory Visit: Payer: Self-pay

## 2020-10-14 ENCOUNTER — Ambulatory Visit (INDEPENDENT_AMBULATORY_CARE_PROVIDER_SITE_OTHER): Payer: BC Managed Care – PPO | Admitting: Internal Medicine

## 2020-10-14 VITALS — BP 112/74 | HR 90 | Ht 67.0 in | Wt 222.0 lb

## 2020-10-14 DIAGNOSIS — E0590 Thyrotoxicosis, unspecified without thyrotoxic crisis or storm: Secondary | ICD-10-CM

## 2020-10-14 LAB — T4, FREE: Free T4: 0.52 ng/dL — ABNORMAL LOW (ref 0.60–1.60)

## 2020-10-14 LAB — TSH: TSH: 9.66 u[IU]/mL — ABNORMAL HIGH (ref 0.35–4.50)

## 2020-10-14 NOTE — Progress Notes (Signed)
Name: Brittney Bass  MRN/ DOB: [id number], 1980-12-19    Age/ Sex: 40 y.o., female     PCP: Lewis Moccasin, MD   Reason for Endocrinology Evaluation: Hyperthyroidism     Initial Endocrinology Clinic Visit: 10/18/2019    PATIENT IDENTIFIER: Brittney Bass is a 40 y.o., female with a past medical history of HTN, dyslipidemia, CAD and Sjogren's syndrome . She has followed with [facility] since 10/18/2019  for consultative assistance with management of her hyperthyroidism.   HISTORICAL SUMMARY:  Pt presented to urgent care in 05/2019 for acute dyspnea, she was tachycardiac at that time, PE was ruled out but was noted to have a suppressed TSH . She was started on Methimazole at the time   No FH of thyroid disease   SUBJECTIVE:   Today (10/14/2020):  Brittney Bass is here for a follow up on hyperthyroidism. She has not been here in 1 year.   She is on Methimazole 3 tablet daily  Weigh thas been stable  Has been off steroids for the past 6 months  Has alternating constipation nor diarrhea  She has no local neck symptoms over the thyroid     MTX has been controlling symptoms      HISTORY:  Past Medical History:  Past Medical History:  Diagnosis Date  . Asthma   . Coronary artery disease   . Dyslipidemia   . GERD (gastroesophageal reflux disease)   . Hypertension   . Immune deficiency disorder [facility])    Past Surgical History:  Past Surgical History:  Procedure Laterality Date  . ANKLE SURGERY      Social History:  reports that she has never smoked. She has never used smokeless tobacco. She reports that she does not drink alcohol and does not use drugs. Family History:  Family History  Problem Relation Age of Onset  . Hypertension Father   . Diabetes Maternal Grandmother   . Diabetes Paternal Grandmother   . Cancer Paternal Grandmother      HOME MEDICATIONS: Allergies as of 10/14/2020      Reactions   Azithromycin Rash   Oropharyngeal  Erythema with Odynophagia   Ibuprofen Hives   Penicillins Itching   Xanax [alprazolam] Itching      Medication List       Accurate as of Oct 14, 2020  4:02 PM. If you have any questions, ask your nurse or doctor.        STOP taking these medications   nitroGLYCERIN 0.4 MG SL tablet Commonly known as: NITROSTAT Stopped by: Scarlette Shorts, MD   PLAQUENIL PO Stopped by: Scarlette Shorts, MD   Ventolin HFA 108 (90 Base) MCG/ACT inhaler Generic drug: albuterol Stopped by: Scarlette Shorts, MD     TAKE these medications   cetirizine 10 MG tablet Commonly known as: ZYRTEC Take 10 mg by mouth daily.   folic acid 1 MG tablet Commonly known as: FOLVITE   methimazole 5 MG tablet Commonly known as: TAPAZOLE Take 3 tablets (15 mg total) by mouth daily.   methotrexate 2.5 MG tablet Commonly known as: RHEUMATREX Take 20 mg by mouth once a week.         OBJECTIVE:   PHYSICAL EXAM: VS: BP 112/74   Pulse 90   Ht 5\' 7"  (1.702 m)   Wt 222 lb (100.7 kg)   LMP 10/13/2020   SpO2 98%   BMI 34.77 kg/m    EXAM: General: Pt appears well and  is in NAD  Neck: General: Supple without adenopathy. Thyroid: Thyroid size enlarged ~40 grams .  No nodules appreciated.  Lungs: Clear with good BS bilat with no rales, rhonchi, or wheezes  Heart: Auscultation: RRR.  Abdomen: Normoactive bowel sounds, soft, nontender, without masses or organomegaly palpable  Extremities:  BL LE: Trace pretibial edema normal ROM and strength.  Mental Status:  Mood and affect: No depression, anxiety, or agitation     DATA REVIEWED:  Results for Brittney Bass, Brittney Bass (MRN [id number]) as of 10/15/2020 10:21  Ref. Range 10/14/2020 16:23  TSH Latest Ref Range: 0.35 - 4.50 uIU/mL 9.66 (H)  T4,Free(Direct) Latest Ref Range: 0.60 - 1.60 ng/dL 8.25 (L)     ASSESSMENT / PLAN / RECOMMENDATIONS:   1. Hyperthyroidism:   -The patient has not been here in a year, we discussed the importance of  follow-up visits as well as periodic monitoring while on methimazole. -Repeat TFTs today show elevated TSH, will reduce methimazole as below   Medications   Decrease methimazole 5 mg to twice daily  Follow-up in 4 months Labs in 8 weeks    Signed electronically by: Lyndle Herrlich, MD  [facility] Endocrinology  [facility] [address] [place] [place], [place] [address] Phone: [phone] FAX: [phone]      CC: Lewis Moccasin, MD [address] [place] [address] Phone: [phone]  Fax: [phone]   Return to Endocrinology clinic as below: No future appointments.

## 2020-10-15 ENCOUNTER — Telehealth: Payer: Self-pay | Admitting: Internal Medicine

## 2020-10-15 DIAGNOSIS — E0590 Thyrotoxicosis, unspecified without thyrotoxic crisis or storm: Secondary | ICD-10-CM

## 2020-10-15 MED ORDER — METHIMAZOLE 5 MG PO TABS
15.0000 mg | ORAL_TABLET | Freq: Two times a day (BID) | ORAL | 6 refills | Status: DC
Start: 2020-10-15 — End: 2020-11-13

## 2020-10-15 NOTE — Telephone Encounter (Signed)
Please let the pt know that the current dose of methimazole is too much , she is now underactive.    Please decrease methimazole to TWO tablets daily instead of 3 tabs   Thanks   Abby Raelyn Mora, MD  [facility] Endocrinology  [facility] [address] [place], [place] [address] Phone: [phone] FAX: [phone]

## 2020-10-15 NOTE — Telephone Encounter (Signed)
Per DPR, left detail message for patient of Dr Shamleffer's comments 

## 2020-10-16 DIAGNOSIS — Z713 Dietary counseling and surveillance: Secondary | ICD-10-CM | POA: Diagnosis not present

## 2020-11-12 ENCOUNTER — Telehealth: Payer: Self-pay | Admitting: Internal Medicine

## 2020-11-12 NOTE — Telephone Encounter (Signed)
Patient needs clarification as soon as possible about how to take her methimazole - she remembers Dr Lonzo Cloud changing her Rx to taking medicine 2 pills once a day but when she got her refill, it says 3 pills twice a day. Please advise asap. (AVS says 3 pills 1x day) [phone]

## 2020-11-13 DIAGNOSIS — R590 Localized enlarged lymph nodes: Secondary | ICD-10-CM | POA: Diagnosis not present

## 2020-11-13 DIAGNOSIS — D8989 Other specified disorders involving the immune mechanism, not elsewhere classified: Secondary | ICD-10-CM | POA: Diagnosis not present

## 2020-11-13 DIAGNOSIS — H02849 Edema of unspecified eye, unspecified eyelid: Secondary | ICD-10-CM | POA: Diagnosis not present

## 2020-11-13 DIAGNOSIS — R5382 Chronic fatigue, unspecified: Secondary | ICD-10-CM | POA: Diagnosis not present

## 2020-11-13 MED ORDER — METHIMAZOLE 5 MG PO TABS: 10.0000 mg | ORAL_TABLET | Freq: Every day | ORAL | 5 refills | Status: DC

## 2020-11-13 NOTE — Telephone Encounter (Signed)
Spoken to patient and confirm that she is supposed to take 2 tablets a day not 3 tablets.  New Rx have been sent

## 2020-11-13 NOTE — Addendum Note (Signed)
Addended by: Tawnya Crook on: 11/13/2020 09:59 AM   Modules accepted: Orders

## 2020-12-03 ENCOUNTER — Other Ambulatory Visit: Payer: Self-pay

## 2020-12-03 ENCOUNTER — Other Ambulatory Visit (INDEPENDENT_AMBULATORY_CARE_PROVIDER_SITE_OTHER): Payer: Self-pay

## 2020-12-03 DIAGNOSIS — E0590 Thyrotoxicosis, unspecified without thyrotoxic crisis or storm: Secondary | ICD-10-CM

## 2020-12-03 LAB — T4, FREE: Free T4: 0.69 ng/dL (ref 0.60–1.60)

## 2020-12-03 LAB — TSH: TSH: 10.86 u[IU]/mL — ABNORMAL HIGH (ref 0.35–5.50)

## 2020-12-04 ENCOUNTER — Telehealth: Payer: Self-pay | Admitting: Internal Medicine

## 2020-12-04 MED ORDER — METHIMAZOLE 5 MG PO TABS: 5.0000 mg | ORAL_TABLET | Freq: Every day | ORAL | 1 refills | Status: DC

## 2020-12-04 NOTE — Telephone Encounter (Signed)
Notified pt medication instructions Rx methimezole by Dr. Lonzo Cloud. Pt voiced understanding.

## 2020-12-04 NOTE — Telephone Encounter (Signed)
Please let the patient know that the current dose of methimazole of 2 tablets a day is way too much for her.   Please asked the patient to decrease methimazole to ONE tablet daily from now on    Thanks Abby Raelyn Mora, MD  [facility] Endocrinology  [facility] [address] [place], [place] [address] Phone: [phone] FAX: [phone]

## 2020-12-11 ENCOUNTER — Other Ambulatory Visit: Payer: BC Managed Care – PPO

## 2021-02-19 ENCOUNTER — Encounter: Payer: Self-pay | Admitting: Internal Medicine

## 2021-02-19 ENCOUNTER — Ambulatory Visit (INDEPENDENT_AMBULATORY_CARE_PROVIDER_SITE_OTHER): Payer: BC Managed Care – PPO | Admitting: Internal Medicine

## 2021-02-19 ENCOUNTER — Other Ambulatory Visit: Payer: Self-pay

## 2021-02-19 ENCOUNTER — Other Ambulatory Visit (INDEPENDENT_AMBULATORY_CARE_PROVIDER_SITE_OTHER): Payer: BC Managed Care – PPO

## 2021-02-19 VITALS — BP 124/80 | HR 80 | Ht 67.0 in | Wt 220.8 lb

## 2021-02-19 DIAGNOSIS — E0590 Thyrotoxicosis, unspecified without thyrotoxic crisis or storm: Secondary | ICD-10-CM

## 2021-02-19 LAB — T4, FREE: Free T4: 0.7 ng/dL (ref 0.60–1.60)

## 2021-02-19 LAB — TSH: TSH: 3.02 u[IU]/mL (ref 0.35–5.50)

## 2021-02-19 NOTE — Progress Notes (Signed)
Name: Brittney Bass  MRN/ DOB: [id number], 1980/09/18    Age/ Sex: 40 y.o., female     PCP: Lewis Moccasin, MD   Reason for Endocrinology Evaluation: Hyperthyroidism     Initial Endocrinology Clinic Visit: 10/18/2019    PATIENT IDENTIFIER: Brittney Bass is a 40 y.o., female with a past medical history of HTN, dyslipidemia, CAD and Sjogren's syndrome . She has followed with [facility] since 10/18/2019  for consultative assistance with management of her hyperthyroidism.   HISTORICAL SUMMARY:  Pt presented to urgent care in 05/2019 for acute dyspnea, she was tachycardiac at that time, PE was ruled out but was noted to have a suppressed TSH . She was started on Methimazole at the time    No FH of thyroid disease   SUBJECTIVE:   Today (02/19/2021):  Brittney Bass is here for a follow up on hyperthyroidism.    She is on Methimazole 1 tablets daily  Weigh thas been stable  Has alternating constipation nor diarrhea  She has chronic neck swelling  Denies plapitations    MTX for Sjogren's syndrome , has knots on the forehead that is attributes to  She did a prednisone taper a month ago      HISTORY:  Past Medical History:  Past Medical History:  Diagnosis Date   Asthma    Coronary artery disease    Dyslipidemia    GERD (gastroesophageal reflux disease)    Hypertension    Immune deficiency disorder (HCC)    Past Surgical History:  Past Surgical History:  Procedure Laterality Date   ANKLE SURGERY     Social History:  reports that she has never smoked. She has never used smokeless tobacco. She reports that she does not drink alcohol and does not use drugs. Family History:  Family History  Problem Relation Age of Onset   Hypertension Father    Diabetes Maternal Grandmother    Diabetes Paternal Grandmother    Cancer Paternal Grandmother      HOME MEDICATIONS: Allergies as of 02/19/2021       Reactions   Azithromycin Rash   Oropharyngeal  Erythema with Odynophagia   Ibuprofen Hives   Penicillins Itching   Xanax [alprazolam] Itching        Medication List        Accurate as of February 19, 2021  1:00 PM. If you have any questions, ask your nurse or doctor.          cetirizine 10 MG tablet Commonly known as: ZYRTEC Take 10 mg by mouth daily.   folic acid 1 MG tablet Commonly known as: FOLVITE   methimazole 5 MG tablet Commonly known as: TAPAZOLE Take 1 tablet (5 mg total) by mouth daily.   methotrexate 2.5 MG tablet Commonly known as: RHEUMATREX Take 20 mg by mouth once a week.          OBJECTIVE:   PHYSICAL EXAM: VS: BP 124/80 (BP Location: Left Arm, Patient Position: Sitting, Cuff Size: Small)   Pulse 80   Ht 5\' 7"  (1.702 m)   Wt 220 lb 12.8 oz (100.2 kg)   SpO2 99%   BMI 34.58 kg/m    EXAM: General: Pt appears well and is in NAD  Neck: General: Supple without adenopathy. Thyroid: Thyroid size enlarged ~40 grams .  No nodules appreciated.  Lungs: Clear with good BS bilat with no rales, rhonchi, or wheezes  Heart: Auscultation: RRR.  Abdomen: Normoactive bowel sounds, soft, nontender,  without masses or organomegaly palpable  Extremities:  BL LE: Trace pretibial edema normal ROM and strength.  Mental Status:  Mood and affect: No depression, anxiety, or agitation     DATA REVIEWED: Results for CAMRIN, LAPRE (MRN [id number]) as of 02/22/2021 08:19  Ref. Range 02/19/2021 16:25  TSH Latest Ref Range: 0.35 - 5.50 uIU/mL 3.02  T4,Free(Direct) Latest Ref Range: 0.60 - 1.60 ng/dL 6.01    ASSESSMENT / PLAN / RECOMMENDATIONS:   Hyperthyroidism:   -The patient has not been here in a year, we discussed the importance of follow-up visits as well as periodic monitoring while on methimazole. -Repeat TFTs today show elevated TSH, will reduce methimazole as below   Medications   Continue methimazole 5 mg 1 tablet daily   Follow-up in 4 months     Signed electronically by: Lyndle Herrlich, MD  [facility] Endocrinology  [facility] [address] [place] [place], [place] [address] Phone: [phone] FAX: [phone]      CC: Lewis Moccasin, MD [address] [place] [address] Phone: [phone]  Fax: [phone]   Return to Endocrinology clinic as below: Future Appointments  Date Time Provider Department Center  02/19/2021  3:20 PM Clementina Mareno, Konrad Dolores, MD [facility] None

## 2021-02-22 ENCOUNTER — Encounter: Payer: Self-pay | Admitting: Internal Medicine

## 2021-03-30 DIAGNOSIS — D8989 Other specified disorders involving the immune mechanism, not elsewhere classified: Secondary | ICD-10-CM | POA: Diagnosis not present

## 2021-03-30 DIAGNOSIS — Z79899 Other long term (current) drug therapy: Secondary | ICD-10-CM | POA: Diagnosis not present

## 2021-04-10 IMAGING — DX DG CHEST 2V
2 series · 2 of 2 positions shown · non-contrast
Comparison: Chest radiograph and chest CT May 07, 2019

CLINICAL DATA: Shortness of breath

EXAM:
CHEST - 2 VIEW

[chest pa]
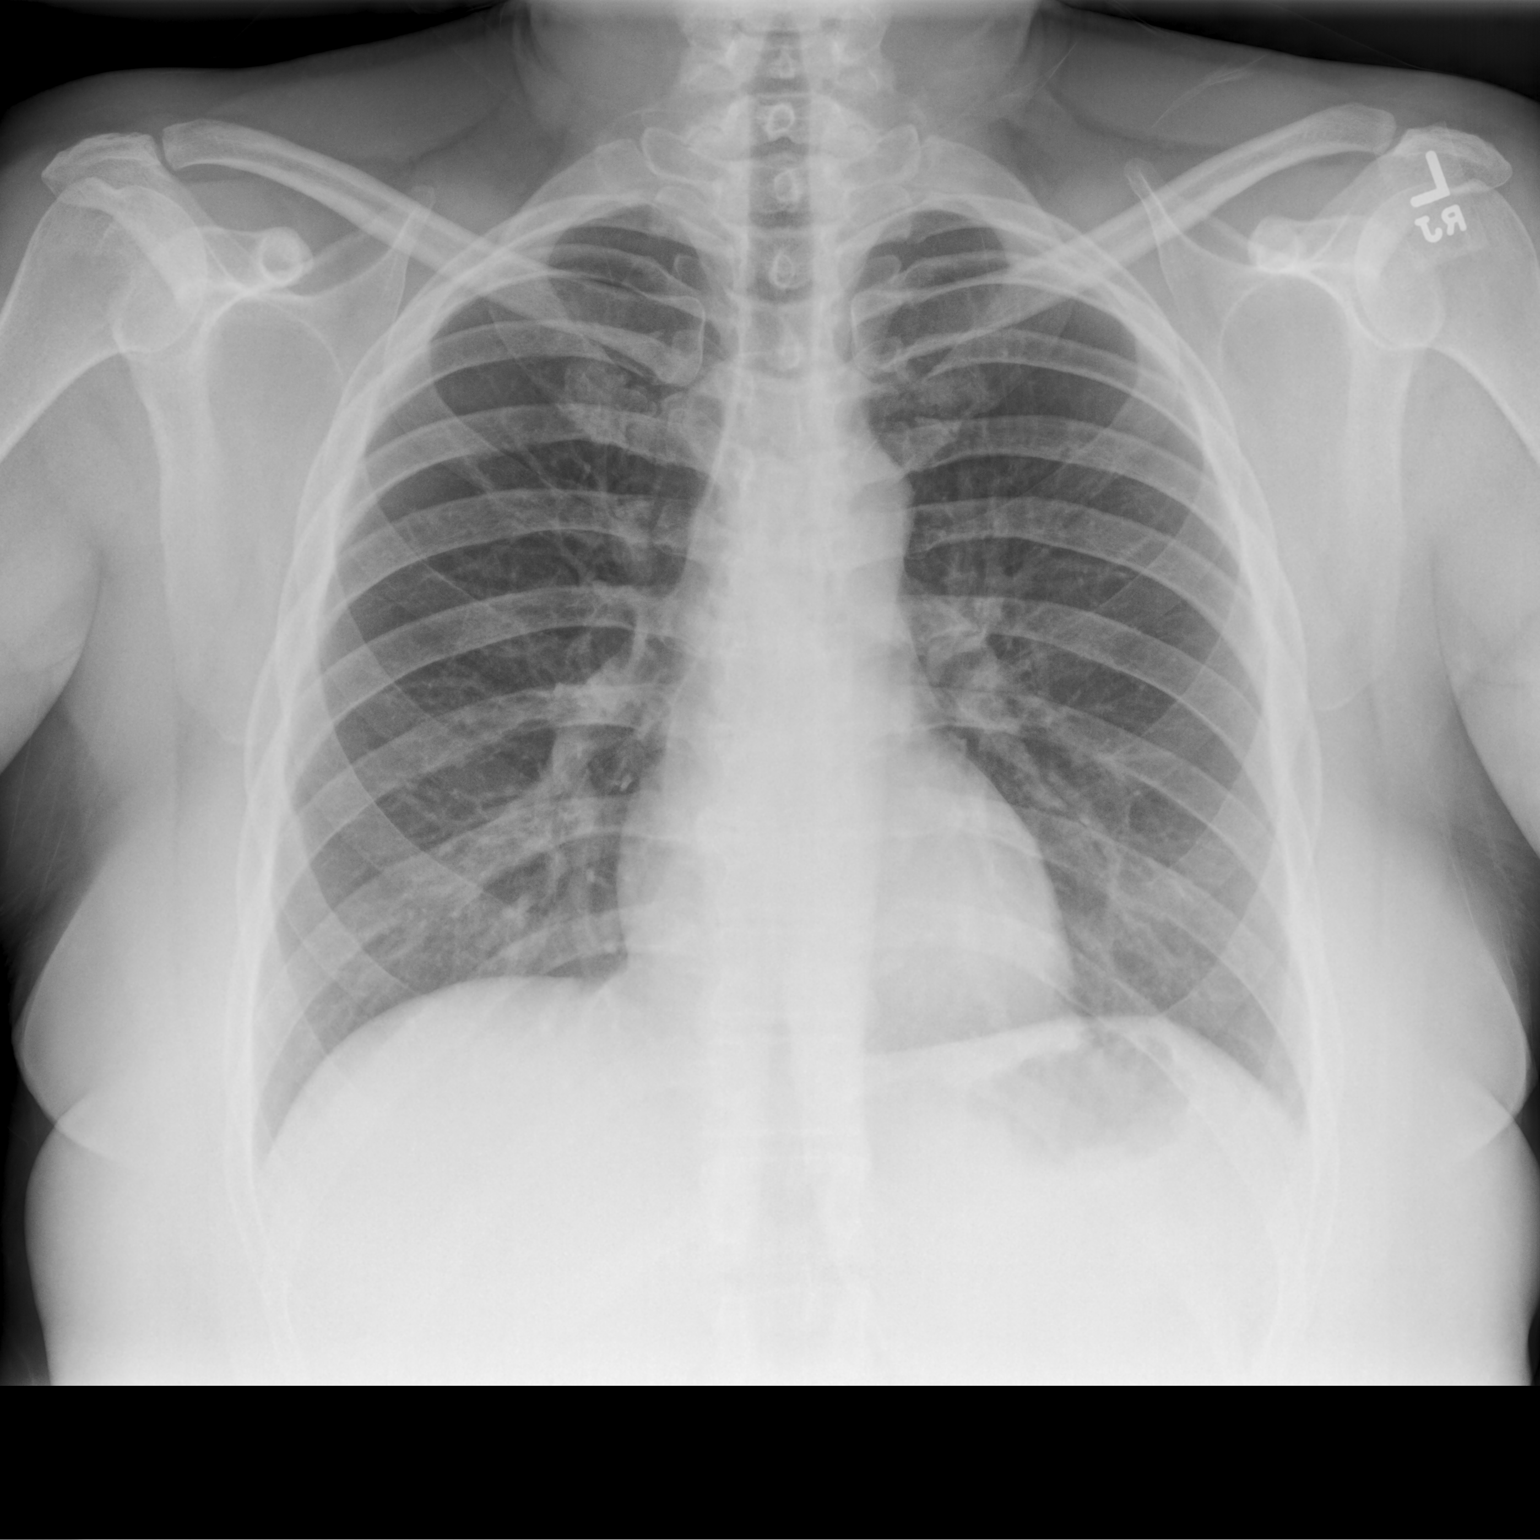

[chest lat]
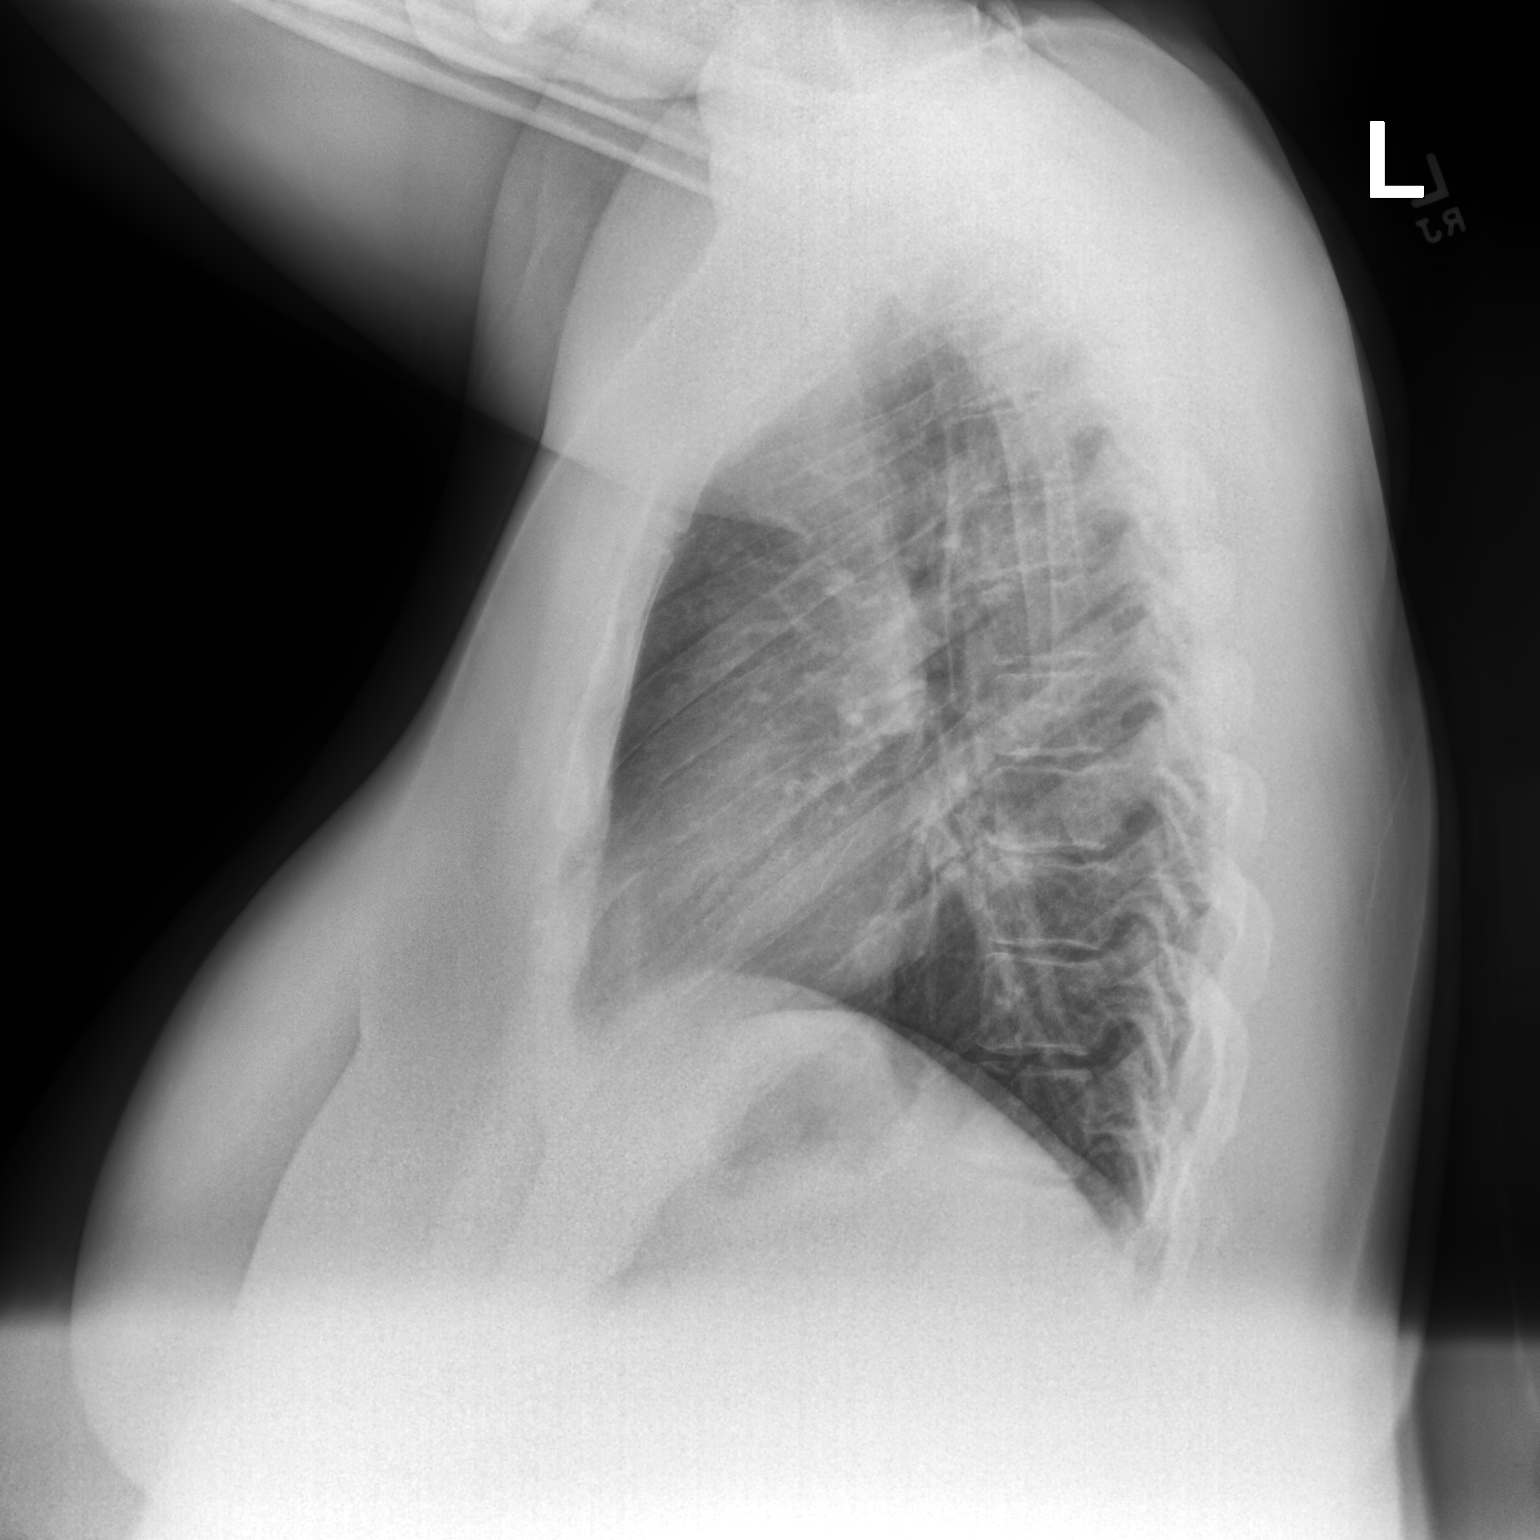

[2 of 2 positions shown; findings below may reference images not displayed]

FINDINGS: Clear. Heart size and pulmonary vascularity are normal. No
adenopathy. No pneumothorax. No bone lesions.
IMPRESSION: Lungs clear.  Cardiac silhouette normal.

## 2021-06-10 DIAGNOSIS — Z79899 Other long term (current) drug therapy: Secondary | ICD-10-CM | POA: Diagnosis not present

## 2021-06-10 DIAGNOSIS — D8989 Other specified disorders involving the immune mechanism, not elsewhere classified: Secondary | ICD-10-CM | POA: Diagnosis not present

## 2021-06-24 ENCOUNTER — Ambulatory Visit (INDEPENDENT_AMBULATORY_CARE_PROVIDER_SITE_OTHER): Payer: BC Managed Care – PPO | Admitting: Internal Medicine

## 2021-06-24 ENCOUNTER — Telehealth: Payer: Self-pay | Admitting: Internal Medicine

## 2021-06-24 ENCOUNTER — Other Ambulatory Visit: Payer: Self-pay

## 2021-06-24 ENCOUNTER — Encounter: Payer: Self-pay | Admitting: Internal Medicine

## 2021-06-24 VITALS — BP 120/72 | HR 69 | Ht 67.0 in | Wt 226.0 lb

## 2021-06-24 DIAGNOSIS — E876 Hypokalemia: Secondary | ICD-10-CM

## 2021-06-24 DIAGNOSIS — E0590 Thyrotoxicosis, unspecified without thyrotoxic crisis or storm: Secondary | ICD-10-CM

## 2021-06-24 LAB — POTASSIUM: Potassium: 4.3 mEq/L (ref 3.5–5.1)

## 2021-06-24 LAB — TSH: TSH: 4.68 u[IU]/mL (ref 0.35–5.50)

## 2021-06-24 LAB — T4, FREE: Free T4: 0.71 ng/dL (ref 0.60–1.60)

## 2021-06-24 MED ORDER — METHIMAZOLE 5 MG PO TABS: 2.5000 mg | ORAL_TABLET | Freq: Every day | ORAL | 3 refills | Status: DC

## 2021-06-24 NOTE — Progress Notes (Signed)
Name: Brittney Bass  MRN/ DOB: [id number], 11-May-1981    Age/ Sex: 41 y.o., female     PCP: Lewis Moccasin, MD   Reason for Endocrinology Evaluation: Hyperthyroidism     Initial Endocrinology Clinic Visit: 10/18/2019    PATIENT IDENTIFIER: [name] is a 41 y.o., female with a past medical history of HTN, dyslipidemia, CAD and Sjogren's syndrome . She has followed with [facility] since 10/18/2019  for consultative assistance with management of her hyperthyroidism.   HISTORICAL SUMMARY:  Pt presented to urgent care in 05/2019 for acute dyspnea, she was tachycardiac at that time, PE was ruled out but was noted to have a suppressed TSH . She was started on Methimazole at the time    No FH of thyroid disease   SUBJECTIVE:   Today (06/24/2021):  [name] is here for a follow up on hyperthyroidism.   She has been noted with weight gain   Weigh thas been stable  Continues with alternating constipation nor diarrhea  She has chronic neck swelling  Denies plapitations     MTX for Sjogren's syndrome ,  Last steroid intake was in early summer   Methimazole 5 mg daily     HISTORY:  Past Medical History:  Past Medical History:  Diagnosis Date   Asthma    Coronary artery disease    Dyslipidemia    GERD (gastroesophageal reflux disease)    Hypertension    Immune deficiency disorder (HCC)    Past Surgical History:  Past Surgical History:  Procedure Laterality Date   ANKLE SURGERY     Social History:  reports that she has never smoked. She has never used smokeless tobacco. She reports that she does not drink alcohol and does not use drugs. Family History:  Family History  Problem Relation Age of Onset   Hypertension Father    Diabetes Maternal Grandmother    Diabetes Paternal Grandmother    Cancer Paternal Grandmother      HOME MEDICATIONS: Allergies as of 06/24/2021       Reactions   Azithromycin Rash   Oropharyngeal Erythema with  Odynophagia   Ibuprofen Hives   Penicillins Itching   Xanax [alprazolam] Itching        Medication List        Accurate as of June 24, 2021  3:10 PM. If you have any questions, ask your nurse or doctor.          cetirizine 10 MG tablet Commonly known as: ZYRTEC Take 10 mg by mouth daily.   folic acid 1 MG tablet Commonly known as: FOLVITE   hydroxychloroquine 200 MG tablet Commonly known as: PLAQUENIL Take 400 mg by mouth daily.   Iron 325 (65 Fe) MG Tabs 1 tablet   methimazole 5 MG tablet Commonly known as: TAPAZOLE Take 1 tablet (5 mg total) by mouth daily.   methotrexate 2.5 MG tablet Commonly known as: RHEUMATREX Take 30 mg by mouth once a week.          OBJECTIVE:   PHYSICAL EXAM: VS: BP 120/72 (BP Location: Left Arm, Patient Position: Sitting, Cuff Size: Small)    Pulse 69    Ht 5\' 7"  (1.702 m)    Wt 226 lb (102.5 kg)    SpO2 99%    BMI 35.40 kg/m    EXAM: General: Pt appears well and is in NAD  Neck: General: Supple without adenopathy. Thyroid: Thyroid gland is prominent  Lungs: Clear with  good BS bilat with no rales, rhonchi, or wheezes  Heart: Auscultation: RRR.  Abdomen: Normoactive bowel sounds, soft, nontender, without masses or organomegaly palpable  Extremities:  BL LE: no pretibial edema   Mental Status:  Mood and affect: No depression, anxiety, or agitation     DATA REVIEWED: Results for STARLETTE, THUROW (MRN [id number]) as of 02/22/2021 08:19  Ref. Range 02/19/2021 16:25  TSH Latest Ref Range: 0.35 - 5.50 uIU/mL 3.02  T4,Free(Direct) Latest Ref Range: 0.60 - 1.60 ng/dL 7.86    ASSESSMENT / PLAN / RECOMMENDATIONS:   Hyperthyroidism:   -Repeat TFTs today show normalization but with her TSH being on the upper limit of normal, will reduce methimazole as below  Medications   Decrease methimazole 5 mg , half a tablet daily   2. Hypokalemia :   - She has history of spontaneous hypokalemia in the past. We discussed  hyperaldo and cushing as differential diagnoses . She  is concerned about high cost of multiple labs, she had to pay $200 for single lab at rheumatology  - We have opted to check Aldo : renin for now and if hypokalemia persists , will consider screening for cushing syndrome    Follow-up in 4 months     Signed electronically by: Lyndle Herrlich, MD  [facility] Endocrinology  [facility] [address] [place], [place] [address] Phone: [phone] FAX: [phone]      CC: Lewis Moccasin, MD [address] [place] [address] Phone: [phone]  Fax: [phone]   Return to Endocrinology clinic as below: Future Appointments  Date Time Provider Department Center  12/22/2021  8:50 AM Chloeanne Poteet, Konrad Dolores, MD [facility] None

## 2021-06-24 NOTE — Telephone Encounter (Signed)
Patient notified and verbalized understanding. 

## 2021-06-24 NOTE — Telephone Encounter (Signed)
Please let the patient know that her thyroid is normal and we have room to decrease her methimazole.   Please asked the patient to reduce methimazole 5 mg to half a tablet daily from now  on     Please let her know that her potassium is normal   Thanks

## 2021-07-08 LAB — ALDOSTERONE + RENIN ACTIVITY W/ RATIO
ALDOS/RENIN RATIO: 2.9 (ref 0.0–30.0)
ALDOSTERONE: 2.7 ng/dL (ref 0.0–30.0)
Renin: 0.944 ng/mL/hr (ref 0.167–5.380)

## 2021-07-31 ENCOUNTER — Other Ambulatory Visit: Payer: Self-pay | Admitting: Internal Medicine

## 2021-09-08 DIAGNOSIS — T7800XA Anaphylactic reaction due to unspecified food, initial encounter: Secondary | ICD-10-CM | POA: Diagnosis not present

## 2021-09-08 DIAGNOSIS — J329 Chronic sinusitis, unspecified: Secondary | ICD-10-CM | POA: Diagnosis not present

## 2021-09-08 DIAGNOSIS — J069 Acute upper respiratory infection, unspecified: Secondary | ICD-10-CM | POA: Diagnosis not present

## 2021-09-08 DIAGNOSIS — B9689 Other specified bacterial agents as the cause of diseases classified elsewhere: Secondary | ICD-10-CM | POA: Diagnosis not present

## 2021-09-15 DIAGNOSIS — J329 Chronic sinusitis, unspecified: Secondary | ICD-10-CM | POA: Diagnosis not present

## 2021-09-15 DIAGNOSIS — J069 Acute upper respiratory infection, unspecified: Secondary | ICD-10-CM | POA: Diagnosis not present

## 2021-09-15 DIAGNOSIS — B9689 Other specified bacterial agents as the cause of diseases classified elsewhere: Secondary | ICD-10-CM | POA: Diagnosis not present

## 2021-10-04 ENCOUNTER — Ambulatory Visit (INDEPENDENT_AMBULATORY_CARE_PROVIDER_SITE_OTHER): Payer: BC Managed Care – PPO | Admitting: Nurse Practitioner

## 2021-10-04 ENCOUNTER — Ambulatory Visit (INDEPENDENT_AMBULATORY_CARE_PROVIDER_SITE_OTHER): Payer: BC Managed Care – PPO

## 2021-10-04 ENCOUNTER — Encounter: Payer: Self-pay | Admitting: Nurse Practitioner

## 2021-10-04 VITALS — BP 124/68 | HR 104 | Temp 98.0°F | Ht 67.0 in | Wt 218.0 lb

## 2021-10-04 DIAGNOSIS — J4541 Moderate persistent asthma with (acute) exacerbation: Secondary | ICD-10-CM | POA: Diagnosis not present

## 2021-10-04 DIAGNOSIS — R059 Cough, unspecified: Secondary | ICD-10-CM | POA: Diagnosis not present

## 2021-10-04 DIAGNOSIS — M359 Systemic involvement of connective tissue, unspecified: Secondary | ICD-10-CM

## 2021-10-04 DIAGNOSIS — D8989 Other specified disorders involving the immune mechanism, not elsewhere classified: Secondary | ICD-10-CM | POA: Diagnosis not present

## 2021-10-04 DIAGNOSIS — R590 Localized enlarged lymph nodes: Secondary | ICD-10-CM | POA: Diagnosis not present

## 2021-10-04 DIAGNOSIS — H02849 Edema of unspecified eye, unspecified eyelid: Secondary | ICD-10-CM | POA: Diagnosis not present

## 2021-10-04 DIAGNOSIS — R5382 Chronic fatigue, unspecified: Secondary | ICD-10-CM | POA: Diagnosis not present

## 2021-10-04 DIAGNOSIS — J209 Acute bronchitis, unspecified: Secondary | ICD-10-CM

## 2021-10-04 LAB — POCT EXHALED NITRIC OXIDE: FeNO level (ppb): 53

## 2021-10-04 MED ORDER — PREDNISONE 10 MG PO TABS: ORAL_TABLET | ORAL | 0 refills | Status: DC

## 2021-10-04 MED ORDER — METHYLPREDNISOLONE ACETATE 80 MG/ML IJ SUSP
120.0000 mg | Freq: Once | INTRAMUSCULAR | Status: AC
  Administered 2021-10-04: 120 mg via INTRAMUSCULAR

## 2021-10-04 MED ORDER — BENZONATATE 100 MG PO CAPS: 100.0000 mg | ORAL_CAPSULE | Freq: Four times a day (QID) | ORAL | 1 refills | Status: DC | PRN

## 2021-10-04 MED ORDER — BENZONATATE 100 MG PO CAPS: 100.0000 mg | ORAL_CAPSULE | Freq: Three times a day (TID) | ORAL | 1 refills | Status: DC | PRN

## 2021-10-04 MED ORDER — BUDESONIDE-FORMOTEROL FUMARATE 80-4.5 MCG/ACT IN AERO: 2.0000 | INHALATION_SPRAY | Freq: Two times a day (BID) | RESPIRATORY_TRACT | 5 refills | Status: DC

## 2021-10-04 NOTE — Patient Instructions (Addendum)
Continue ventolin 1-2 puffs every 6 hours as needed for shortness of breath, wheezing or coughing spell ?Continue Zyrtec 10 mg daily  ?Continue Dayquil as directed  ? ?-Start Symbicort 2 puffs Twice daily. Brush tongue and rinse mouth afterwards ?-Tessalon Perles 1 capsule every 8 hours as needed for cough  ?-Prednisone taper. 4 tabs for 3 days, then 3 tabs for 3 days, 2 tabs for 3 days, then 1 tab for 3 days, then stop. Take in AM with food. Start tomorrow ? ?Frequent sips of water. Avoid throat clearing. Suck on sugar free hard candies throughout the day  ? ?Follow up in 6 weeks with Dr. Vaughan Browner or Alanson Aly. If symptoms do not improve or worsen, please contact office for sooner follow up or seek emergency care. ?

## 2021-10-04 NOTE — Assessment & Plan Note (Signed)
Followed by Dr. Dierdre Forth with rheumatology.  Currently on Plaquenil and methotrexate.  Symptoms are overall stable.  Suspect that her cough is primarily related to her asthma given elevation in exhaled nitric oxide.  CXR with with clear lungs today.  We will schedule pulmonary function testing for monitoring at next visit.  Advised that if her symptoms do not improve, we would need to consider further imaging with CT scans of the chest to evaluate for ILD. ?

## 2021-10-04 NOTE — Progress Notes (Signed)
Please notify patient lungs were clear on CXR today. Thanks!

## 2021-10-04 NOTE — Assessment & Plan Note (Signed)
Unresolving asthmatic flare.  Experience some improvement with prednisone and Doxy course; however once off symptoms returned.  Depo injection x1 today.  Treat with extended prednisone taper.  CXR to rule out superimposed infection and need for further antibiotic therapy.  We will start her on maintenance regimen using Symbicort twice daily.  She has a significant allergic component to her asthma; recently started taking Zyrtec and feels as though this is helping as well.  If symptoms persist despite the maintenance regimen and use of Zyrtec, will add on Singulair and referred to allergist. ? ?Patient Instructions  ?Continue ventolin 1-2 puffs every 6 hours as needed for shortness of breath, wheezing or coughing spell ?Continue Zyrtec 10 mg daily  ?Continue Dayquil as directed  ? ?-Start Symbicort 2 puffs Twice daily. Brush tongue and rinse mouth afterwards ?-Tessalon Perles 1 capsule every 8 hours as needed for cough  ?-Prednisone taper. 4 tabs for 3 days, then 3 tabs for 3 days, 2 tabs for 3 days, then 1 tab for 3 days, then stop. Take in AM with food. Start tomorrow ? ?Frequent sips of water. Avoid throat clearing. Suck on sugar free hard candies throughout the day  ? ?Follow up in 6 weeks with Dr. Isaiah Serge or Philis Nettle. If symptoms do not improve or worsen, please contact office for sooner follow up or seek emergency care. ? ? ?

## 2021-10-04 NOTE — Progress Notes (Signed)
@Patient  ID: Brittney Bass, female    DOB: 1981/05/19, 41 y.o.   MRN: [id number]  Chief Complaint  Patient presents with   Follow-up    Pt is here for an asthma flair up. Pt states she is having an persistent cough for about  2 months now due to season changing. Pt has done a course of doxy and prednisone but has not helped the cough.     Referring provider: Lewis Moccasin, MD  HPI: 41 year old female, never smoker followed for moderate persistent asthma and DOE.  She is a patient of Dr. Shirlee More and last seen in office 03/12/2020.  She has a history of connective tissue disease, possibly Sjogren's, treated with methotrexate and Plaquenil and is followed by Dr. Dierdre Forth with rheumatology.  Past medical history significant for hypothyroidism, CAD, GERD, HLD.  TEST/EVENTS:  09/29/2017 FeNO 61 09/29/2017: WBC 7.7, eos 14.4%, absolute eosinophil count 1100 09/29/2017: IgE 3328, RAST panel positive for dust mite, cat, dog, grass, mold, tree pollen 03/07/2019: Positive ANA 1: 320 03/12/2020 CXR 2 view: Both lungs are clear.  There is no active cardiopulmonary disease noted. 09/10/2020 PFTs: FVC 111, FEV120, ratio 87, TLC 91%, DLCO corrected for alveolar volume 105%.  No significant BD; did have mid flow reversibility (19%)  03/12/2020: OV with Dr. Isaiah Serge.  Reported doing well.  Had been able to wean off prednisone by rheum. Breathing was stable on PRN albuterol - not requiring maintenance therapies. CXR clear; wanted to avoid CT imaging or excessive testing d/t stability. Plans for repeat PFTs in 6 months.   10/04/2021: Today-acute visit Patient presents today for persistent dry cough.  Her symptoms started about 2 months ago and have progressively worsened.  She was seen by her PCP last week and treated with doxycycline and prednisone courses.  Felt as though they helped initially but her cough returned once she completed them.  She has some increased shortness of breath with coughing spells.  Has not  noticed any significant wheezing.  Does continue to have some allergy type symptoms.  She started Zyrtec for these and feels as though they are helping.  Denies any hemoptysis, lower extremity swelling, fevers, recent sick exposures.  She has been using her albuterol more frequently.  She is not on any maintenance inhalers.  FeNO 54 ppb  Allergies  Allergen Reactions   Azithromycin Rash    Oropharyngeal Erythema with Odynophagia   Ibuprofen Hives   Penicillins Itching   Xanax [Alprazolam] Itching    Immunization History  Administered Date(s) Administered   DTaP 08/06/2015   Moderna Sars-Covid-2 Vaccination 02/21/2020    Past Medical History:  Diagnosis Date   Asthma    Coronary artery disease    Dyslipidemia    GERD (gastroesophageal reflux disease)    Hypertension    Immune deficiency disorder (HCC)     Tobacco History: Social History   Tobacco Use  Smoking Status Never  Smokeless Tobacco Never   Counseling given: Not Answered   Outpatient Medications Prior to Visit  Medication Sig Dispense Refill   cetirizine (ZYRTEC) 10 MG tablet Take 10 mg by mouth daily.     Ferrous Sulfate (IRON) 325 (65 Fe) MG TABS 1 tablet     folic acid (FOLVITE) 1 MG tablet   10   hydroxychloroquine (PLAQUENIL) 200 MG tablet Take 400 mg by mouth daily.     methimazole (TAPAZOLE) 5 MG tablet Take 0.5 tablets (2.5 mg total) by mouth daily. 45 tablet 3   methotrexate (  RHEUMATREX) 2.5 MG tablet Take 30 mg by mouth once a week.  1   VENTOLIN HFA 108 (90 Base) MCG/ACT inhaler SMARTSIG:1-2 Puff(s) By Mouth Every 6 Hours PRN     No facility-administered medications prior to visit.     Review of Systems:   Constitutional: No weight loss or gain, night sweats, fevers, chills, fatigue, or lassitude. HEENT: No headaches, difficulty swallowing, tooth/dental problems, or sore throat. No itching, ear ache. + Sneezing, nasal congestion (improved) CV:  No chest pain, orthopnea, PND, swelling in lower  extremities, anasarca, dizziness, palpitations, syncope Resp: +shortness of breath with coughing spells; dry, hacking cough. No excess mucus or change in color of mucus. No hemoptysis. No wheezing.  No chest wall deformity GI:  No heartburn, indigestion, abdominal pain, nausea, vomiting, diarrhea, change in bowel habits, loss of appetite, bloody stools.  Skin: No rash, lesions, ulcerations MSK:  No joint pain or swelling.  No decreased range of motion.  No back pain. Neuro: No dizziness or lightheadedness.  Psych: No depression or anxiety. Mood stable.     Physical Exam:  BP 124/68 (BP Location: Right Arm, Patient Position: Sitting, Cuff Size: Normal)   Pulse (!) 104   Temp 98 F (36.7 C) (Oral)   Ht 5\' 7"  (1.702 m)   Wt 218 lb (98.9 kg)   SpO2 100%   BMI 34.14 kg/m   GEN: Pleasant, interactive, well-appearing; obese; in no acute distress. HEENT:  Normocephalic and atraumatic. PERRLA. Sclera white. Nasal turbinates pink, moist and patent bilaterally. No rhinorrhea present. Oropharynx pink and moist, without exudate or edema. No lesions, ulcerations, or postnasal drip.  NECK:  Supple w/ fair ROM. No JVD present. Normal carotid impulses w/o bruits. Thyroid symmetrical with no goiter or nodules palpated. No lymphadenopathy.   CV: RRR, no m/r/g, no peripheral edema. Pulses intact, +2 bilaterally. No cyanosis, pallor or clubbing. PULMONARY:  Unlabored, regular breathing.  Minimal end expiratory wheezes bilaterally A&P. No accessory muscle use. No dullness to percussion. GI: BS present and normoactive. Soft, non-tender to palpation. No organomegaly or masses detected. No CVA tenderness. MSK: No erythema, warmth or tenderness. Cap refil <2 sec all extrem. No deformities or joint swelling noted.  Neuro: A/Ox3. No focal deficits noted.   Skin: Warm, no lesions or rashe Psych: Normal affect and behavior. Judgement and thought content appropriate.     Lab Results:  CBC    Component Value  Date/Time   WBC 6.8 10/18/2019 1213   RBC 3.39 (L) 10/18/2019 1213   HGB 10.4 (L) 10/18/2019 1213   HCT 31.8 (L) 10/18/2019 1213   PLT 333.0 10/18/2019 1213   MCV 93.8 10/18/2019 1213   MCH 24.6 (L) 08/09/2017 1650   MCHC 32.8 10/18/2019 1213   RDW 13.8 10/18/2019 1213   LYMPHSABS 0.8 10/18/2019 1213   MONOABS 0.5 10/18/2019 1213   EOSABS 1.0 (H) 10/18/2019 1213   BASOSABS 0.1 10/18/2019 1213    BMET    Component Value Date/Time   NA 137 10/18/2019 1213   K 4.3 06/24/2021 0938   CL 105 10/18/2019 1213   CO2 29 10/18/2019 1213   GLUCOSE 92 10/18/2019 1213   BUN 7 10/18/2019 1213   CREATININE 0.80 10/18/2019 1213   CALCIUM 8.1 (L) 10/18/2019 1213   GFRNONAA >60 08/09/2017 1650   GFRAA >60 08/09/2017 1650    BNP No results found for: BNP   Imaging:  DG Chest 2 View  Result Date: 10/04/2021 CLINICAL DATA:  Cough EXAM: CHEST - 2  VIEW COMPARISON:  Chest x-ray 03/12/2020 FINDINGS: Heart size and mediastinal contours are within normal limits. No suspicious pulmonary opacities identified. No pleural effusion or pneumothorax visualized. No acute osseous abnormality appreciated. IMPRESSION: No acute intrathoracic process identified. Electronically Signed   By: Jannifer Hick M.D.   On: 10/04/2021 11:36    methylPREDNISolone acetate (DEPO-MEDROL) injection 120 mg     Date Action Dose Route User   10/04/2021 1039 Given 120 mg Intramuscular (Left Ventrogluteal) Lavetta Nielsen L, RN          Latest Ref Rng & Units 09/10/2020    3:06 PM 10/03/2017    3:00 PM  PFT Results  FVC-Pre L 3.57   3.06    FVC-Predicted Pre % 106   90    FVC-Post L 3.72   3.14    FVC-Predicted Post % 111   93    Pre FEV1/FVC % % 87   84    Post FEV1/FCV % % 89   89    FEV1-Pre L 3.12   2.59    FEV1-Predicted Pre % 112   92    FEV1-Post L 3.32   2.79    DLCO uncorrected ml/min/mmHg 24.79   21.96    DLCO UNC% % 105   81    DLCO corrected ml/min/mmHg 24.79   25.54    DLCO COR %Predicted % 105   94     DLVA Predicted % 119   113    TLC L 4.92   5.18    TLC % Predicted % 91   96    RV % Predicted % 95   112      Lab Results  Component Value Date   NITRICOXIDE 61 09/29/2017        Assessment & Plan:   Moderate persistent asthma with acute bronchitis and acute exacerbation Unresolving asthmatic flare.  Experience some improvement with prednisone and Doxy course; however once off symptoms returned.  Depo injection x1 today.  Treat with extended prednisone taper.  CXR to rule out superimposed infection and need for further antibiotic therapy.  We will start her on maintenance regimen using Symbicort twice daily.  She has a significant allergic component to her asthma; recently started taking Zyrtec and feels as though this is helping as well.  If symptoms persist despite the maintenance regimen and use of Zyrtec, will add on Singulair and referred to allergist.  Patient Instructions  Continue ventolin 1-2 puffs every 6 hours as needed for shortness of breath, wheezing or coughing spell Continue Zyrtec 10 mg daily  Continue Dayquil as directed   -Start Symbicort 2 puffs Twice daily. Brush tongue and rinse mouth afterwards -Tessalon Perles 1 capsule every 8 hours as needed for cough  -Prednisone taper. 4 tabs for 3 days, then 3 tabs for 3 days, 2 tabs for 3 days, then 1 tab for 3 days, then stop. Take in AM with food. Start tomorrow  Frequent sips of water. Avoid throat clearing. Suck on sugar free hard candies throughout the day   Follow up in 6 weeks with Dr. Isaiah Serge or Katie Roberts Bon,NP. If symptoms do not improve or worsen, please contact office for sooner follow up or seek emergency care.    Connective tissue disorder (HCC) Followed by Dr. Dierdre Forth with rheumatology.  Currently on Plaquenil and methotrexate.  Symptoms are overall stable.  Suspect that her cough is primarily related to her asthma given elevation in exhaled nitric oxide.  CXR with with clear lungs  today.  We will  schedule pulmonary function testing for monitoring at next visit.  Advised that if her symptoms do not improve, we would need to consider further imaging with CT scans of the chest to evaluate for ILD.   I spent 35 minutes of dedicated to the care of this patient on the date of this encounter to include pre-visit review of records, face-to-face time with the patient discussing conditions above, post visit ordering of testing, clinical documentation with the electronic health record, making appropriate referrals as documented, and communicating necessary findings to members of the patients care team.  Noemi Chapel, NP 10/04/2021  Pt aware and understands NP's role.

## 2021-11-16 ENCOUNTER — Telehealth: Payer: Self-pay | Admitting: Nurse Practitioner

## 2021-11-17 ENCOUNTER — Ambulatory Visit: Payer: BC Managed Care – PPO | Admitting: Nurse Practitioner

## 2021-11-17 MED ORDER — MONTELUKAST SODIUM 10 MG PO TABS: 10.0000 mg | ORAL_TABLET | Freq: Every day | ORAL | 11 refills | Status: DC

## 2021-11-17 NOTE — Telephone Encounter (Signed)
Called patient to verify pharmacy and the medication. Patient states that she can not afford to co see [place] every month due to the bill. I was told by patient her bill was over $500 last month. She just wanted medication filled. She will make follow up when she has the money to afford a follow up with [place]. Nothing further needed

## 2021-12-22 ENCOUNTER — Ambulatory Visit (INDEPENDENT_AMBULATORY_CARE_PROVIDER_SITE_OTHER): Payer: BC Managed Care – PPO | Admitting: Internal Medicine

## 2021-12-22 ENCOUNTER — Encounter: Payer: Self-pay | Admitting: Internal Medicine

## 2021-12-22 VITALS — BP 120/70 | HR 95 | Ht 67.0 in | Wt 230.0 lb

## 2021-12-22 DIAGNOSIS — E0590 Thyrotoxicosis, unspecified without thyrotoxic crisis or storm: Secondary | ICD-10-CM

## 2021-12-22 LAB — TSH: TSH: 2.67 u[IU]/mL (ref 0.35–5.50)

## 2021-12-22 NOTE — Progress Notes (Signed)
Name: Brittney Bass  MRN/ DOB: [id number], Apr 16, 1981    Age/ Sex: 41 y.o., female     PCP: Lewis Moccasin, MD   Reason for Endocrinology Evaluation: Hyperthyroidism     Initial Endocrinology Clinic Visit: 10/18/2019    PATIENT IDENTIFIER: Brittney Bass is a 41 y.o., female with a past medical history of HTN, dyslipidemia, CAD and Sjogren's syndrome . She has followed with [facility] since 10/18/2019  for consultative assistance with management of her hyperthyroidism.   HISTORICAL SUMMARY:  Pt presented to urgent care in 05/2019 for acute dyspnea, she was tachycardiac at that time, PE was ruled out but was noted to have a suppressed TSH . She was started on Methimazole at the time    TRAB was detectable but not elevated 10/2019 1.6 IU/L  Thyroid ultrasound 11/2019 no discrete nodules    No FH of thyroid disease   SUBJECTIVE:   Today (12/22/2021):  [name] is here for a follow up on hyperthyroidism.   She has been noted with weight gain  She has constipation but as long as she incorporates  dairy in her food She has chronic neck swelling , stable  Denies plapitations  Denies eye symptoms No heat/cold intolerance     MTX for Sjogren's syndrome ,   Methimazole 5 mg ,half tablet daily    HISTORY:  Past Medical History:  Past Medical History:  Diagnosis Date   Asthma    Coronary artery disease    Dyslipidemia    GERD (gastroesophageal reflux disease)    Hypertension    Immune deficiency disorder (HCC)    Past Surgical History:  Past Surgical History:  Procedure Laterality Date   ANKLE SURGERY     Social History:  reports that she has never smoked. She has never used smokeless tobacco. She reports that she does not drink alcohol and does not use drugs. Family History:  Family History  Problem Relation Age of Onset   Hypertension Father    Diabetes Maternal Grandmother    Diabetes Paternal Grandmother    Cancer Paternal Grandmother       HOME MEDICATIONS: Allergies as of 12/22/2021       Reactions   Azithromycin Rash   Oropharyngeal Erythema with Odynophagia   Ibuprofen Hives   Penicillins Itching   Xanax [alprazolam] Itching        Medication List        Accurate as of December 22, 2021  9:01 AM. If you have any questions, ask your nurse or doctor.          STOP taking these medications    benzonatate 100 MG capsule Commonly known as: TESSALON Stopped by: Scarlette Shorts, MD   predniSONE 10 MG tablet Commonly known as: DELTASONE Stopped by: Scarlette Shorts, MD       TAKE these medications    budesonide-formoterol 80-4.5 MCG/ACT inhaler Commonly known as: Symbicort Inhale 2 puffs into the lungs in the morning and at bedtime.   cetirizine 10 MG tablet Commonly known as: ZYRTEC Take 10 mg by mouth daily.   folic acid 1 MG tablet Commonly known as: FOLVITE   hydroxychloroquine 200 MG tablet Commonly known as: PLAQUENIL Take 400 mg by mouth daily.   Iron 325 (65 Fe) MG Tabs 1 tablet   methimazole 5 MG tablet Commonly known as: TAPAZOLE Take 0.5 tablets (2.5 mg total) by mouth daily.   methotrexate 2.5 MG tablet Commonly known as: RHEUMATREX Take  30 mg by mouth once a week.   montelukast 10 MG tablet Commonly known as: SINGULAIR Take 1 tablet (10 mg total) by mouth at bedtime.   Ventolin HFA 108 (90 Base) MCG/ACT inhaler Generic drug: albuterol SMARTSIG:1-2 Puff(s) By Mouth Every 6 Hours PRN          OBJECTIVE:   PHYSICAL EXAM: VS: BP 120/70 (BP Location: Left Arm, Patient Position: Sitting, Cuff Size: Large)   Pulse 95   Ht 5\' 7"  (1.702 m)   Wt 230 lb (104.3 kg)   SpO2 99%   BMI 36.02 kg/m    EXAM: General: Pt appears well and is in NAD  Neck: General: Supple without adenopathy. Thyroid: Thyroid gland is prominent  Lungs: Clear with good BS bilat with no rales, rhonchi, or wheezes  Heart: Auscultation: RRR.  Abdomen: Normoactive bowel sounds,  soft, nontender, without masses or organomegaly palpable  Extremities:  BL LE: trace pretibial edema   Mental Status:  Mood and affect: No depression, anxiety, or agitation     DATA REVIEWED:   Latest Reference Range & Units 12/22/21 09:14  TSH 0.35 - 5.50 uIU/mL 2.67  Thyroxine (T4) 5.1 - 11.9 mcg/dL 7.9    Latest Reference Range & Units 06/24/21 09:38  ALDOSTERONE 0.0 - 30.0 ng/dL 2.7  Renin 06/26/21 - 4.034 ng/mL/hr 0.944  ALDOS/RENIN RATIO 0.0 - 30.0  2.9    ASSESSMENT / PLAN / RECOMMENDATIONS:   Hyperthyroidism:  - Most likely due to Graves' disease - No nodules on thyroid  ultrasound 11/2019 -Repeat TFTs today are normal Medications   Continue methimazole 5 mg , half a tablet daily    Follow-up in 6 months     Signed electronically by: 12/2019, MD  [facility]  [facility] [address] [place] [address] [place], [place] Phone: [phone] FAX: [phone]      CC: [phone], MD [address] [place] Phone: [phone]  Fax: [phone]   Return to Endocrinology clinic as below: No future appointments.

## 2021-12-23 ENCOUNTER — Encounter: Payer: Self-pay | Admitting: Internal Medicine

## 2021-12-23 LAB — T4: T4, Total: 7.9 ug/dL (ref 5.1–11.9)

## 2022-01-04 DIAGNOSIS — D8989 Other specified disorders involving the immune mechanism, not elsewhere classified: Secondary | ICD-10-CM | POA: Diagnosis not present

## 2022-01-04 DIAGNOSIS — Z79899 Other long term (current) drug therapy: Secondary | ICD-10-CM | POA: Diagnosis not present

## 2022-01-04 DIAGNOSIS — R7989 Other specified abnormal findings of blood chemistry: Secondary | ICD-10-CM | POA: Diagnosis not present

## 2022-02-14 DIAGNOSIS — R590 Localized enlarged lymph nodes: Secondary | ICD-10-CM | POA: Diagnosis not present

## 2022-02-14 DIAGNOSIS — D8989 Other specified disorders involving the immune mechanism, not elsewhere classified: Secondary | ICD-10-CM | POA: Diagnosis not present

## 2022-02-14 DIAGNOSIS — H02849 Edema of unspecified eye, unspecified eyelid: Secondary | ICD-10-CM | POA: Diagnosis not present

## 2022-02-14 DIAGNOSIS — R5382 Chronic fatigue, unspecified: Secondary | ICD-10-CM | POA: Diagnosis not present

## 2022-03-07 DIAGNOSIS — Z114 Encounter for screening for human immunodeficiency virus [HIV]: Secondary | ICD-10-CM | POA: Diagnosis not present

## 2022-03-07 DIAGNOSIS — Z0000 Encounter for general adult medical examination without abnormal findings: Secondary | ICD-10-CM | POA: Diagnosis not present

## 2022-03-10 ENCOUNTER — Other Ambulatory Visit: Payer: Self-pay | Admitting: Family Medicine

## 2022-03-10 DIAGNOSIS — Z1231 Encounter for screening mammogram for malignant neoplasm of breast: Secondary | ICD-10-CM

## 2022-03-10 DIAGNOSIS — Z0000 Encounter for general adult medical examination without abnormal findings: Secondary | ICD-10-CM | POA: Diagnosis not present

## 2022-04-08 ENCOUNTER — Inpatient Hospital Stay: Admission: RE | Admit: 2022-04-08 | Payer: BC Managed Care – PPO | Source: Ambulatory Visit

## 2022-04-15 ENCOUNTER — Telehealth: Payer: Self-pay | Admitting: Nurse Practitioner

## 2022-04-15 MED ORDER — PREDNISONE 10 MG PO TABS: 20.0000 mg | ORAL_TABLET | Freq: Every day | ORAL | 0 refills | Status: AC

## 2022-04-15 NOTE — Telephone Encounter (Signed)
I would recommend she restart the singulair. Symbicort still could be the culprit. Ensure she is brushing, rinsing and gargling after use. Let's try adding a spacer to it as well. Possible this is also from postnasal drainage; sometimes we don't notice any nasal congestion with this. Have her use flonase nasal spray 2 sprays each nostril daily, saline nasal irrigation 1-2 times a day. Guaifenesin [phone] mg Twice daily to help keep secretions thin. If not already on an antihistamine, would start xyzal, claritin or zyrtec. Limit talking for the next few days. Avoid throat clearing. Suck on sugar free hard candies throughout the day. We can do a short course of prednisone for laryngitis - 20 mg daily for 5 days. If none of this helps, she'll need to go to ENT for further evaluation. Thanks.

## 2022-04-15 NOTE — Telephone Encounter (Signed)
Called and spoke with patient. She verbalized understanding. I offered to send in a RX for Flonase but she stated that her insurance will not pay for it. She is aware that it is available OTC. She confirmed that she is rinsing her mouth after using the Symbicort as well as gargling.   RX for prednisone has been sent in.   Nothing further needed at time of call.

## 2022-04-15 NOTE — Telephone Encounter (Signed)
Called and spoke with patient. She stated that she has been hoarse for the past 2 weeks. She did some research on her medications and noticed hoarseness is listed as a side effect for both the Symbicort and montelukast. She has not had any problems with the Symbicort as she has been using this since April 2023. She started the montelukast 10mg  back in June 2023 with no issues. Denies starting any other new medications.   She stated that she fines overall well. She does have a slight sore throat but the pain is mostly on the right side. Denied any fever, difficulty swallowing or increased SOB. She stopped the montelukast about a week ago in hopes she would improve but she has not yet.   She wanted to know if July 2023 had any recommendations. Pharmacy is [facility] on [address].   Brittney Bass, can you please advise? Thanks!

## 2022-04-25 ENCOUNTER — Telehealth: Payer: Self-pay | Admitting: Pulmonary Disease

## 2022-04-25 NOTE — Telephone Encounter (Signed)
She's been on the Symbicort since May and had developed symptoms just a few weeks ago from the previous phone message last week. Symbicort is unlikely related to the weight gain; not a common side effect. This is usually seen with oral steroids, not inhaled. Ok to try off of it if her breathing is stable to see if symptoms improve. If she has increased use of albuterol or worsening symptoms, she will need an OV to troubleshoot/determine next steps. I would still recommend referral to ENT in case hoarseness does not resolve; can take a while to get in with them. No further prednisone recommended without seeing her in office. Thanks.

## 2022-04-25 NOTE — Telephone Encounter (Signed)
Please send referral to ENT for evaluation of voice hoarseness. Ensure she is using Symbicort with a spacer; lower suspicion it is related to this given length of time she has been on it but it is still worth trying. Thanks.

## 2022-04-25 NOTE — Telephone Encounter (Signed)
Called and spoke with pt letting her know recs per [facility] and she verbalized understanding. Pt said she already has an ENT in [place] so stated to her to contact their office to schedule an appt. Nothing further needed.

## 2022-04-25 NOTE — Telephone Encounter (Signed)
Called patient and she is still having hoarseness even after finishing her course of prednisone. The prednisone was 20mg  for 5 days. She states that she is still using the Symbicort and rinsing her mouth out and everything after using it. She states that she might need a stronger dose of prednisone. She states she is still using the mucinex and it is helping a little bit. But she is still really horse.  Please advise 

## 2022-04-25 NOTE — Telephone Encounter (Signed)
Spoke with pt who states that the Symbicort has cased hoarseness, wt gain, and a white coating on tongue (no sores). Pt states she does not use a spacer but dose gargle and rinse after each puff. Pt wants to try stopping it and see if symptoms resolve. Pt states she is also taking Mucinex and that her cough has resolved for the most part. Pt is also requesting PRN prednisone taper is case she were to start having coughing trouble again. RN instructed pt we do not usually do prednisone tapers but I would ask providers advice on the subject.  Florentina Addison can you please advise?

## 2022-04-26 ENCOUNTER — Other Ambulatory Visit: Payer: Self-pay | Admitting: Internal Medicine

## 2022-05-10 DIAGNOSIS — R590 Localized enlarged lymph nodes: Secondary | ICD-10-CM | POA: Diagnosis not present

## 2022-05-10 DIAGNOSIS — D8989 Other specified disorders involving the immune mechanism, not elsewhere classified: Secondary | ICD-10-CM | POA: Diagnosis not present

## 2022-05-10 DIAGNOSIS — Z79899 Other long term (current) drug therapy: Secondary | ICD-10-CM | POA: Diagnosis not present

## 2022-05-25 DIAGNOSIS — R490 Dysphonia: Secondary | ICD-10-CM | POA: Diagnosis not present

## 2022-05-31 ENCOUNTER — Encounter (HOSPITAL_COMMUNITY): Payer: Self-pay | Admitting: Emergency Medicine

## 2022-05-31 ENCOUNTER — Emergency Department (HOSPITAL_COMMUNITY): Payer: BC Managed Care – PPO

## 2022-05-31 ENCOUNTER — Emergency Department (HOSPITAL_COMMUNITY)
Admission: EM | Admit: 2022-05-31 | Discharge: 2022-05-31 | Disposition: A | Discharge status: Home / Self Care | Payer: BC Managed Care – PPO | Attending: Emergency Medicine | Admitting: Emergency Medicine

## 2022-05-31 DIAGNOSIS — R069 Unspecified abnormalities of breathing: Secondary | ICD-10-CM | POA: Diagnosis not present

## 2022-05-31 DIAGNOSIS — R0689 Other abnormalities of breathing: Secondary | ICD-10-CM | POA: Diagnosis not present

## 2022-05-31 DIAGNOSIS — R0602 Shortness of breath: Secondary | ICD-10-CM | POA: Insufficient documentation

## 2022-05-31 DIAGNOSIS — R000 Tachycardia, unspecified: Secondary | ICD-10-CM | POA: Diagnosis not present

## 2022-05-31 LAB — CBC
HCT: 31.4 % — ABNORMAL LOW (ref 36.0–46.0)
Hemoglobin: 10.2 g/dL — ABNORMAL LOW (ref 12.0–15.0)
MCH: 28 pg (ref 26.0–34.0)
MCHC: 32.5 g/dL (ref 30.0–36.0)
MCV: 86.3 fL (ref 80.0–100.0)
Platelets: 401 10*3/uL — ABNORMAL HIGH (ref 150–400)
RBC: 3.64 MIL/uL — ABNORMAL LOW (ref 3.87–5.11)
RDW: 13.5 % (ref 11.5–15.5)
WBC: 7.3 10*3/uL (ref 4.0–10.5)
nRBC: 0 % (ref 0.0–0.2)

## 2022-05-31 LAB — BASIC METABOLIC PANEL
Anion gap: 7 (ref 5–15)
BUN: 5 mg/dL — ABNORMAL LOW (ref 6–20)
CO2: 22 mmol/L (ref 22–32)
Calcium: 8.4 mg/dL — ABNORMAL LOW (ref 8.9–10.3)
Chloride: 103 mmol/L (ref 98–111)
Creatinine, Ser: 1.07 mg/dL — ABNORMAL HIGH (ref 0.44–1.00)
GFR, Estimated: >60 mL/min (ref >60)
Glucose, Bld: 157 mg/dL — ABNORMAL HIGH (ref 70–99)
Potassium: 3 mmol/L — ABNORMAL LOW (ref 3.5–5.1)
Sodium: 132 mmol/L — ABNORMAL LOW (ref 135–145)

## 2022-05-31 MED ORDER — PREDNISONE 20 MG PO TABS
60.0000 mg | ORAL_TABLET | Freq: Once | ORAL | Status: AC
  Administered 2022-05-31: 60 mg via ORAL
  Filled 2022-05-31: qty 3

## 2022-05-31 MED ORDER — FLUTICASONE PROPIONATE 50 MCG/ACT NA SUSP: 2.0000 | Freq: Every day | NASAL | 0 refills | Status: DC

## 2022-05-31 MED ORDER — PREDNISONE 10 MG (21) PO TBPK: ORAL_TABLET | Freq: Every day | ORAL | 0 refills | Status: DC

## 2022-05-31 NOTE — ED Notes (Signed)
Pt O2 sats on RA are 100%. Pt requesting humidified oxygen. Pt made aware that she is going to waiting room and we can accommodate that when she gets to a room but she can stay on 2L [place] while waiting.

## 2022-05-31 NOTE — ED Provider Notes (Signed)
[facility] EMERGENCY DEPARTMENT Provider Note   CSN: [id number] Arrival date & time: 05/31/22  0818     History  Chief Complaint  Patient presents with   Shortness of Breath    Brittney Bass is a 41 y.o. female.   Shortness of Breath  Patient is a 41 year old female with Sjogren's disease presented emergency room today with 2 months of hoarse voice and nosebleeds and sinus congestion.  She has been seen by ear nose or throat and has actually had a scope done through the nose and was told that there is irritation I was given a low-dose of prednisone for 5 days.  She states she has had continued symptoms that are similar.  No fevers no difficulty breathing apart from an episode this morning where she felt like she was choking.  No NV.      Home Medications Prior to Admission medications   Medication Sig Start Date End Date Taking? Authorizing Provider  fluticasone (FLONASE) 50 MCG/ACT nasal spray Place 2 sprays into both nostrils daily for 14 days. 05/31/22 06/14/22 Yes Kahlil Cowans S, PA  predniSONE (STERAPRED UNI-PAK 21 TAB) 10 MG (21) TBPK tablet Take by mouth daily. Take 6 tabs by mouth daily  for 2 days, then 5 tabs for 2 days, then 4 tabs for 2 days, then 3 tabs for 2 days, 2 tabs for 2 days, then 1 tab by mouth daily for 2 days 05/31/22  Yes Keiondra Brookover S, PA  budesonide-formoterol (SYMBICORT) 80-4.5 MCG/ACT inhaler Inhale 2 puffs into the lungs in the morning and at bedtime. Patient not taking: Reported on 12/22/2021 10/04/21   Clayton Bibles, NP  cetirizine (ZYRTEC) 10 MG tablet Take 10 mg by mouth daily.    [provider]  Ferrous Sulfate (IRON) 325 (65 Fe) MG TABS 1 tablet    [provider]  folic acid (FOLVITE) 1 MG tablet  02/01/18   [provider]  hydroxychloroquine (PLAQUENIL) 200 MG tablet Take 400 mg by mouth daily. 02/12/21   [provider]  methimazole (TAPAZOLE) 5 MG tablet TAKE 0.5 TABLETS (2.5  MG TOTAL) BY MOUTH DAILY. 04/27/22   Shamleffer, Melanie Crazier, MD  methotrexate (RHEUMATREX) 2.5 MG tablet Take 30 mg by mouth once a week. 01/29/18   [provider]  montelukast (SINGULAIR) 10 MG tablet Take 1 tablet (10 mg total) by mouth at bedtime. 11/17/21   Cobb, Karie Schwalbe, NP  VENTOLIN HFA 108 (90 Base) MCG/ACT inhaler SMARTSIG:1-2 Puff(s) By Mouth Every 6 Hours PRN 08/23/21   [provider]      Allergies    Azithromycin, Ibuprofen, Penicillins, and Xanax [alprazolam]    Review of Systems   Review of Systems  Respiratory:  Positive for shortness of breath.     Physical Exam Updated Vital Signs BP 118/74   Pulse 84   Temp 99.1 F (37.3 C) (Oral)   Resp 14   SpO2 100%  Physical Exam Vitals and nursing note reviewed.  Constitutional:      General: She is not in acute distress. HENT:     Head: Normocephalic and atraumatic.     Nose: Nose normal.     Mouth/Throat:     Mouth: Mucous membranes are moist.     Comments: Mild cobblestoning in posterior pharynx Eyes:     General: No scleral icterus. Cardiovascular:     Rate and Rhythm: Normal rate and regular rhythm.     Pulses: Normal pulses.  Heart sounds: Normal heart sounds.  Pulmonary:     Effort: Pulmonary effort is normal. No respiratory distress.     Breath sounds: No wheezing.  Abdominal:     Palpations: Abdomen is soft.     Tenderness: There is no abdominal tenderness. There is no guarding or rebound.  Musculoskeletal:     Cervical back: Normal range of motion.     Right lower leg: No edema.     Left lower leg: No edema.  Skin:    General: Skin is warm and dry.     Capillary Refill: Capillary refill takes less than 2 seconds.  Neurological:     Mental Status: She is alert. Mental status is at baseline.  Psychiatric:        Mood and Affect: Mood normal.        Behavior: Behavior normal.     ED Results / Procedures / Treatments   Labs (all labs ordered are listed, but only  abnormal results are displayed) Labs Reviewed  CBC - Abnormal; Notable for the following components:      Result Value   RBC 3.64 (*)    Hemoglobin 10.2 (*)    HCT 31.4 (*)    Platelets 401 (*)    All other components within normal limits  BASIC METABOLIC PANEL - Abnormal; Notable for the following components:   Sodium 132 (*)    Potassium 3.0 (*)    Glucose, Bld 157 (*)    BUN 5 (*)    Creatinine, Ser 1.07 (*)    Calcium 8.4 (*)    All other components within normal limits    EKG None  Radiology DG Chest 2 View  Result Date: 05/31/2022 CLINICAL DATA:  Shortness of breath. EXAM: CHEST - 2 VIEW COMPARISON:  Oct 04, 2021. FINDINGS: The heart size and mediastinal contours are within normal limits. Both lungs are clear. The visualized skeletal structures are unremarkable. IMPRESSION: No active cardiopulmonary disease. Electronically Signed   By: Lupita Raider M.D.   On: 05/31/2022 09:17    Procedures Procedures    Medications Ordered in ED Medications - No data to display  ED Course/ Medical Decision Making/ A&P                           Medical Decision Making Amount and/or Complexity of Data Reviewed Labs: ordered. Radiology: ordered.   Patient is a 41 year old female with Sjogren's disease presented emergency room today with 2 months of hoarse voice and nosebleeds and sinus congestion.  She has been seen by ear nose or throat and has actually had a scope done through the nose and was told that there is irritation I was given a low-dose of prednisone for 5 days.  She states she has had continued symptoms that are similar.  No fevers no difficulty breathing apart from an episode this morning where she felt like she was choking.  No NV.    Physical exam is generally relatively unremarkable.  Slightly hoarse voice but no stridor, speaking in full sentences no dyspnea evident on exam lungs are clear.  She is already had a scope done by ENT I will start her on some  more prednisone on a taper and recommend she continue to follow-up with ENT and PCP return the emergency room for any new or concerning symptoms.  Patient had lab work obtained in triage which is without any significant acute abnormal findings.    Final  Clinical Impression(s) / ED Diagnoses Final diagnoses:  Shortness of breath    Rx / DC Orders ED Discharge Orders          Ordered    predniSONE (STERAPRED UNI-PAK 21 TAB) 10 MG (21) TBPK tablet  Daily        05/31/22 1258    fluticasone (FLONASE) 50 MCG/ACT nasal spray  Daily        05/31/22 1259              Pati Gallo [place], [place] 05/31/22 1318    Davonna Belling, MD 06/01/22 0700

## 2022-05-31 NOTE — Discharge Instructions (Addendum)
I have given you a short course of steroids to take as prescribed.  Follow-up with your primary care provider as well as your ear nose and throat doctor.  Return to the ER for any new or concerning symptoms as needed.

## 2022-05-31 NOTE — ED Notes (Signed)
Pt 100% on 2L, pt requesting O2

## 2022-05-31 NOTE — ED Triage Notes (Signed)
Pt arrives via EMS with reports of bloody phlegm for 2 months. Stopped a inhaler weeks ago due to this. Endorses SOB and nonproductive cough today. Pt took home inhaler today. Pt reports seeing ENT last week and their plan was "nothing." EMS has pt on saline nebulizer.

## 2022-06-27 ENCOUNTER — Encounter: Payer: Self-pay | Admitting: Internal Medicine

## 2022-06-27 ENCOUNTER — Ambulatory Visit (INDEPENDENT_AMBULATORY_CARE_PROVIDER_SITE_OTHER): Payer: BC Managed Care – PPO | Admitting: Internal Medicine

## 2022-06-27 VITALS — BP 118/86 | HR 96 | Ht 67.0 in | Wt 217.4 lb

## 2022-06-27 DIAGNOSIS — E0590 Thyrotoxicosis, unspecified without thyrotoxic crisis or storm: Secondary | ICD-10-CM

## 2022-06-27 LAB — T4, FREE: Free T4: 0.73 ng/dL (ref 0.60–1.60)

## 2022-06-27 LAB — TSH: TSH: 2.3 u[IU]/mL (ref 0.35–5.50)

## 2022-06-27 MED ORDER — METHIMAZOLE 5 MG PO TABS: 5.0000 mg | ORAL_TABLET | ORAL | 3 refills | Status: DC

## 2022-06-27 NOTE — Progress Notes (Addendum)
Name: Brittney Bass  MRN/ DOB: [id number], [birth year]    Age/ Sex: 42 y.o., female     PCP: Fanny Bien, MD   Reason for Endocrinology Evaluation: Hyperthyroidism     Initial Endocrinology Clinic Visit: 10/18/2019    PATIENT IDENTIFIER: Brittney Bass is a 42 y.o., female with a past medical history of HTN, dyslipidemia, CAD and Sjogren's syndrome . She has followed with [facility] since 10/18/2019  for consultative assistance with management of her hyperthyroidism.   HISTORICAL SUMMARY:  Pt presented to urgent care in 05/2019 for acute dyspnea, she was tachycardiac at that time, PE was ruled out but was noted to have a suppressed TSH . She was started on Methimazole at the time    TRAB was detectable but not elevated 10/2019 1.6 IU/L  Thyroid ultrasound 11/2019 no discrete nodules    No FH of thyroid disease   SUBJECTIVE:   Today (06/27/2022):  [name] is here for a follow up on hyperthyroidism.    She presented to the ED on 05/31/2022 for shortness of breath She was seen by ENT on 05/25/2022 for hoarseness, flexible laryngoscope shows dry nasal mucosa with old blood present throughout the nasal cavity bilaterally, hypopharynx shows no concerning lesions She has been having hoarseness for 3 months  that she attributes to budesonide inhaler   She has been noted with weight loss She has chronic neck swelling , stable  Denies plapitations  Denies eye symptoms Denies tremors  Denies constipation or diarrhea      MTX for Sjogren's syndrome  Methimazole 5 mg ,half tablet daily    HISTORY:  Past Medical History:  Past Medical History:  Diagnosis Date   Asthma    Coronary artery disease    Dyslipidemia    GERD (gastroesophageal reflux disease)    Hypertension    Immune deficiency disorder ([facility])    Past Surgical History:  Past Surgical History:  Procedure Laterality Date   ANKLE SURGERY     Social History:  reports that she has  never smoked. She has never used smokeless tobacco. She reports that she does not drink alcohol and does not use drugs. Family History:  Family History  Problem Relation Age of Onset   Hypertension Father    Diabetes Maternal Grandmother    Diabetes Paternal Grandmother    Cancer Paternal Grandmother      HOME MEDICATIONS: Allergies as of 06/27/2022       Reactions   Azithromycin Rash   Oropharyngeal Erythema with Odynophagia   Ibuprofen Hives   Penicillins Itching   Xanax [alprazolam] Itching        Medication List        Accurate as of June 27, 2022  8:45 AM. If you have any questions, ask your nurse or doctor.          BENADRYL ALLERGY PO Take by mouth. What changed: Another medication with the same name was removed. Continue taking this medication, and follow the directions you see here. Changed by: Dorita Sciara, MD   budesonide-formoterol 80-4.5 MCG/ACT inhaler Commonly known as: Symbicort Inhale 2 puffs into the lungs in the morning and at bedtime.   cetirizine 10 MG tablet Commonly known as: ZYRTEC Take 10 mg by mouth daily.   fluticasone 50 MCG/ACT nasal spray Commonly known as: FLONASE Place 2 sprays into both nostrils daily for 14 days.   folic acid 1 MG tablet Commonly known as: FOLVITE   hydroxychloroquine  200 MG tablet Commonly known as: PLAQUENIL Take 400 mg by mouth daily.   Iron 325 (65 Fe) MG Tabs 1 tablet   methimazole 5 MG tablet Commonly known as: TAPAZOLE TAKE 0.5 TABLETS (2.5 MG TOTAL) BY MOUTH DAILY.   methotrexate 2.5 MG tablet Commonly known as: RHEUMATREX Take 30 mg by mouth once a week.   montelukast 10 MG tablet Commonly known as: SINGULAIR Take 1 tablet (10 mg total) by mouth at bedtime.   predniSONE 10 MG (21) Tbpk tablet Commonly known as: STERAPRED UNI-PAK 21 TAB Take by mouth daily. Take 6 tabs by mouth daily  for 2 days, then 5 tabs for 2 days, then 4 tabs for 2 days, then 3 tabs for 2 days, 2 tabs  for 2 days, then 1 tab by mouth daily for 2 days   Ventolin HFA 108 (90 Base) MCG/ACT inhaler Generic drug: albuterol SMARTSIG:1-2 Puff(s) By Mouth Every 6 Hours PRN          OBJECTIVE:   PHYSICAL EXAM: VS: BP 118/86 (BP Location: Left Arm, Patient Position: Sitting, Cuff Size: Normal)   Pulse 96   Ht 5\' 7"  (1.702 m)   Wt 217 lb 6.4 oz (98.6 kg)   SpO2 99%   BMI 34.05 kg/m    EXAM: General: Pt appears well and is in NAD  Neck: General: Supple without adenopathy. Thyroid: Thyroid gland is prominent  Lungs: Clear with good BS bilat with no rales, rhonchi, or wheezes  Heart: Auscultation: RRR.  Abdomen: Normoactive bowel sounds, soft, nontender, without masses or organomegaly palpable  Extremities:  BL LE: trace pretibial edema   Mental Status:  Mood and affect: No depression, anxiety, or agitation     DATA REVIEWED:   Latest Reference Range & Units 06/27/22 08:59  TSH 0.35 - 5.50 uIU/mL 2.30  T4,Free(Direct) 0.60 - 1.60 ng/dL 0.73    Latest Reference Range & Units 06/24/21 09:38  ALDOSTERONE 0.0 - 30.0 ng/dL 2.7  Renin 0.167 - 5.380 ng/mL/hr 0.944  ALDOS/RENIN RATIO 0.0 - 30.0  2.9    ASSESSMENT / PLAN / RECOMMENDATIONS:   Hyperthyroidism:  - Most likely due to Graves' disease - No nodules on thyroid  ultrasound 11/2019 -Repeat TFTs today are normal   Medications   Change  methimazole 5 mg , 1 tablet three times weekly     Follow-up in 6 months   Addendum: Results and new recommendation on methimazole was discussed with the patient on 06/27/2022 at [place]    Signed electronically by: Mack Guise, MD  [facility]  [facility] [address] [place], [place] [address] Phone: [phone] FAX: [phone]      CC: Fanny Bien, MD [address] [place] [address] Phone: [phone]  Fax: [phone]   Return to Endocrinology clinic as below: No future appointments.

## 2022-08-11 DIAGNOSIS — R590 Localized enlarged lymph nodes: Secondary | ICD-10-CM | POA: Diagnosis not present

## 2022-08-11 DIAGNOSIS — H02849 Edema of unspecified eye, unspecified eyelid: Secondary | ICD-10-CM | POA: Diagnosis not present

## 2022-08-11 DIAGNOSIS — D8989 Other specified disorders involving the immune mechanism, not elsewhere classified: Secondary | ICD-10-CM | POA: Diagnosis not present

## 2022-08-11 DIAGNOSIS — R5382 Chronic fatigue, unspecified: Secondary | ICD-10-CM | POA: Diagnosis not present

## 2022-11-02 IMAGING — DX DG CHEST 2V
2 series · 2 of 2 positions shown · non-contrast
Comparison: Chest x-ray 03/12/2020

CLINICAL DATA: Cough

EXAM:
CHEST - 2 VIEW

[chest pa]
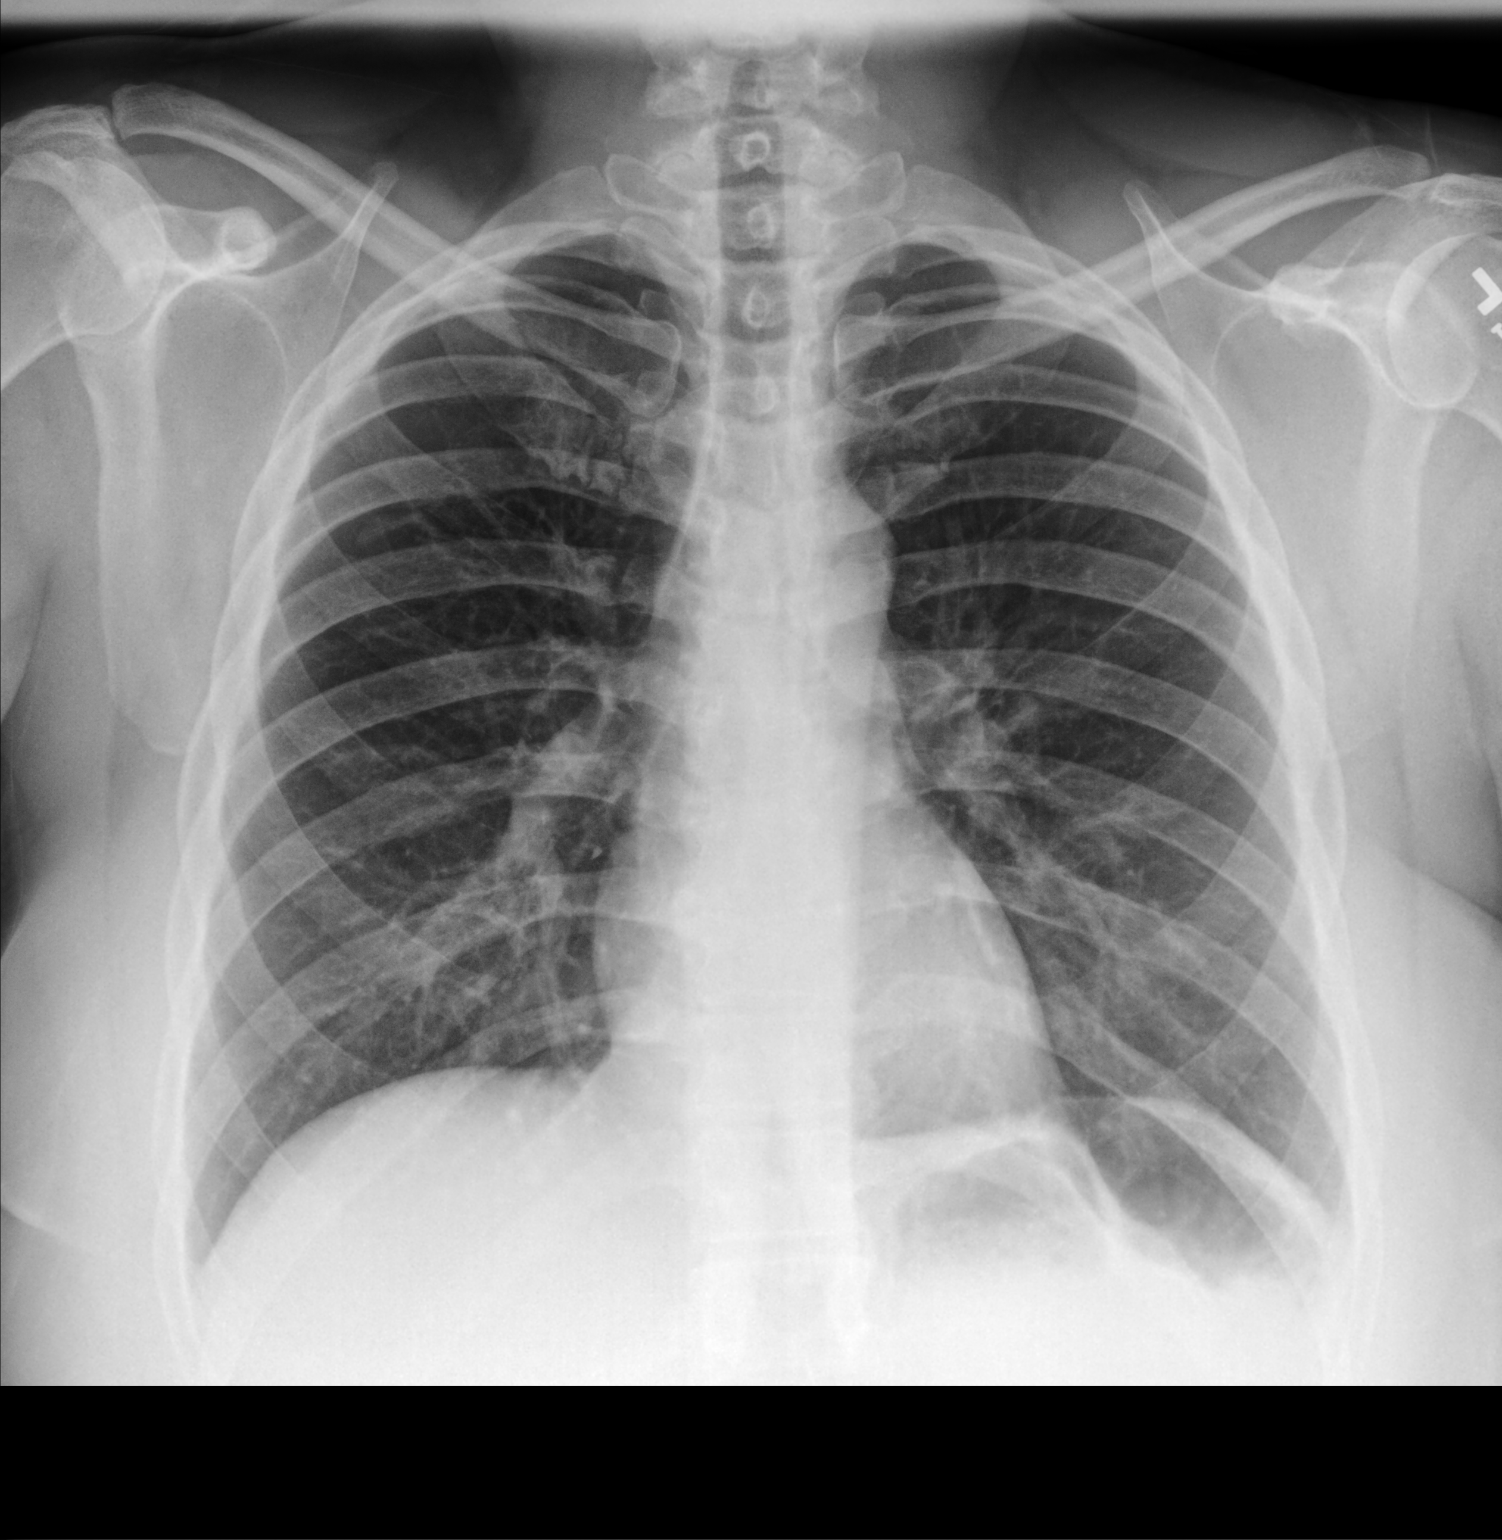

[chest lat]
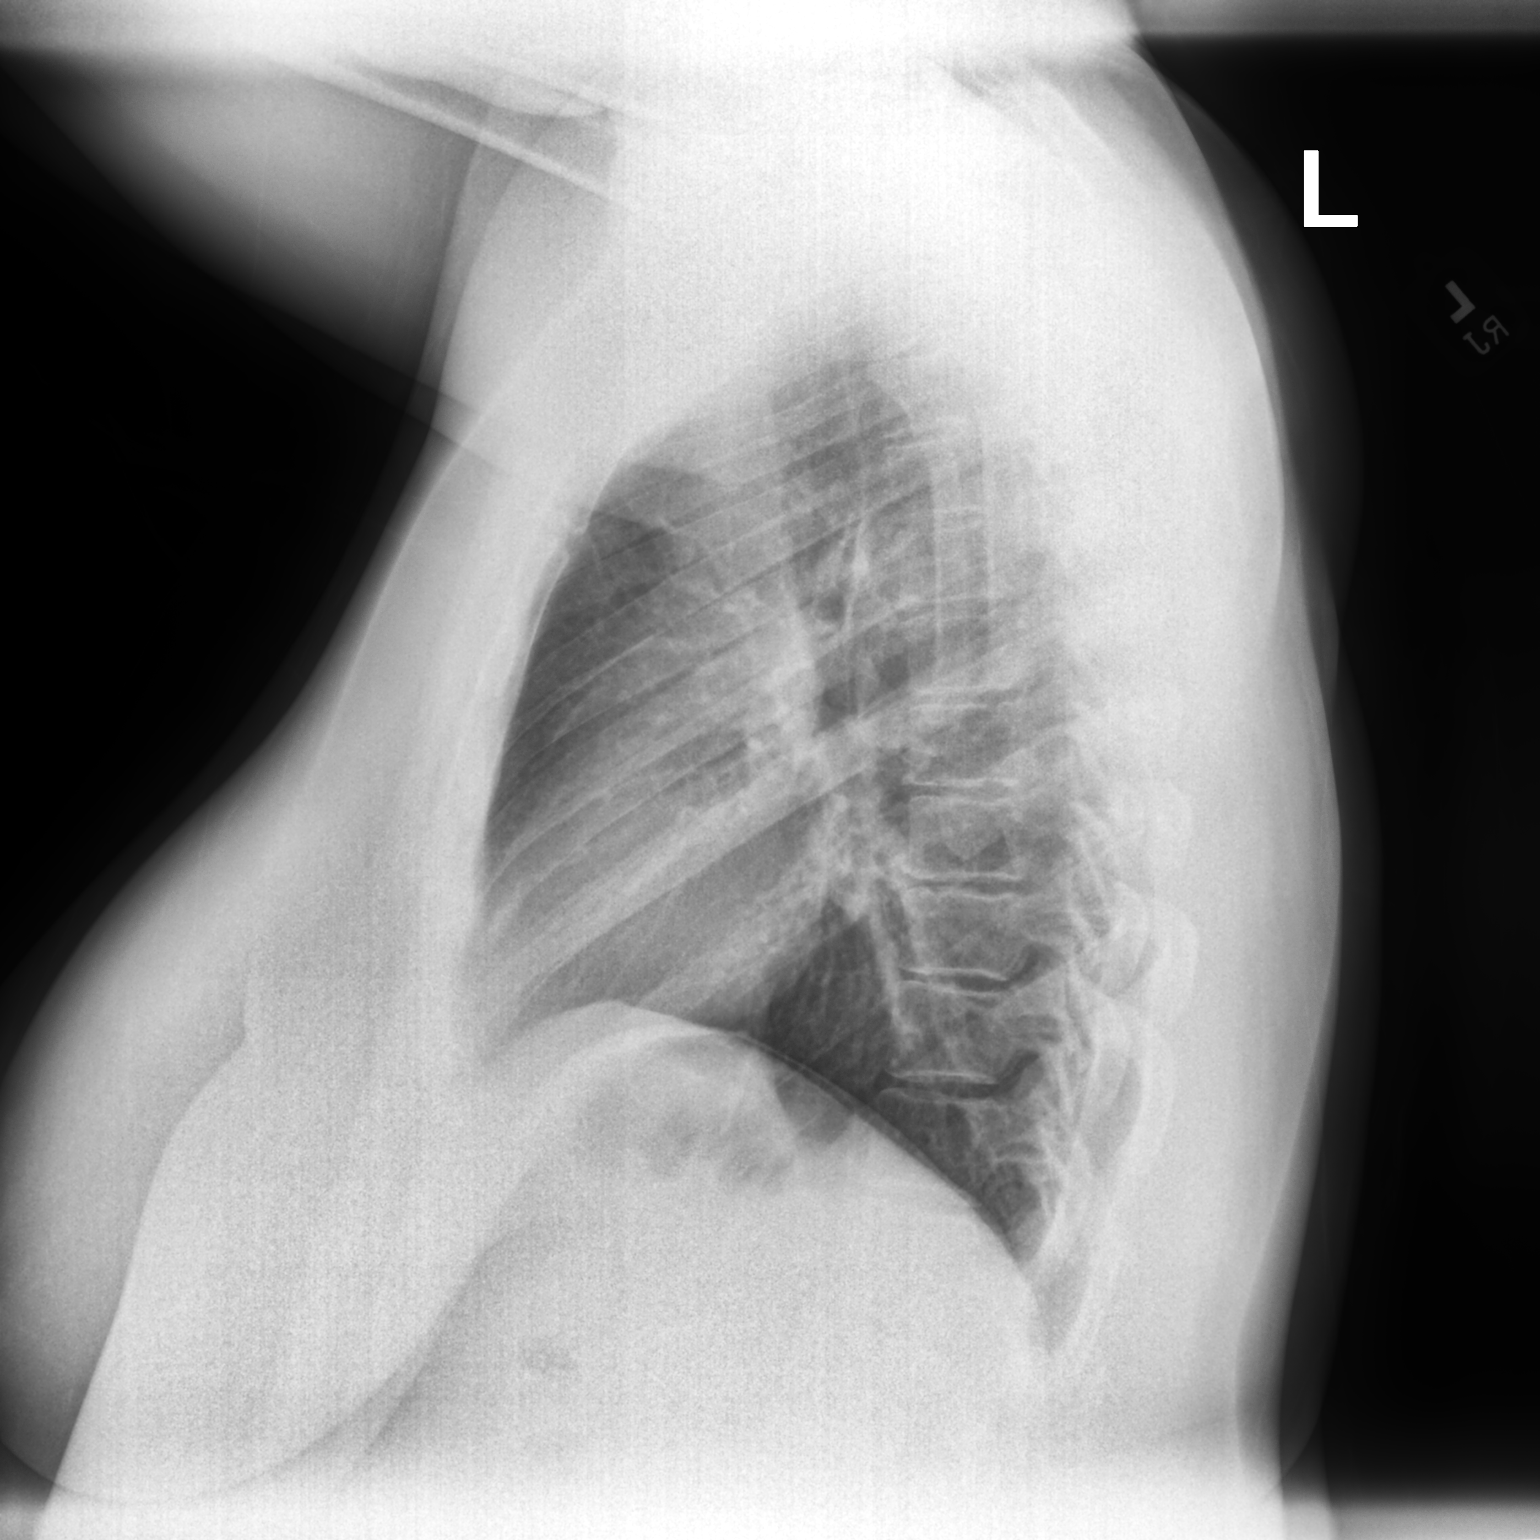

[2 of 2 positions shown; findings below may reference images not displayed]

FINDINGS: Heart size and mediastinal contours are within normal limits. No
suspicious pulmonary opacities identified.

No pleural effusion or pneumothorax visualized.

No acute osseous abnormality appreciated.
IMPRESSION: No acute intrathoracic process identified.

## 2022-11-10 DIAGNOSIS — D8989 Other specified disorders involving the immune mechanism, not elsewhere classified: Secondary | ICD-10-CM | POA: Diagnosis not present

## 2023-01-02 ENCOUNTER — Encounter: Payer: Self-pay | Admitting: Internal Medicine

## 2023-01-02 ENCOUNTER — Ambulatory Visit: Payer: BC Managed Care – PPO | Admitting: Internal Medicine

## 2023-01-02 VITALS — BP 110/68 | HR 70 | Ht 67.0 in | Wt 210.0 lb

## 2023-01-02 DIAGNOSIS — E0590 Thyrotoxicosis, unspecified without thyrotoxic crisis or storm: Secondary | ICD-10-CM | POA: Diagnosis not present

## 2023-01-02 LAB — T4, FREE: Free T4: 0.92 ng/dL (ref 0.60–1.60)

## 2023-01-02 LAB — TSH: TSH: 1.63 u[IU]/mL (ref 0.35–5.50)

## 2023-01-02 MED ORDER — METHIMAZOLE 5 MG PO TABS: 5.0000 mg | ORAL_TABLET | ORAL | 3 refills | Status: DC

## 2023-01-02 NOTE — Progress Notes (Signed)
Name: Brittney Bass  MRN/ DOB: [id number], [birth year]    Age/ Sex: 42 y.o., female     PCP: Lewis Moccasin, MD   Reason for Endocrinology Evaluation: Hyperthyroidism     Initial Endocrinology Clinic Visit: 10/18/2019    PATIENT IDENTIFIER: Brittney Bass is a 42 y.o., female with a past medical history of HTN, dyslipidemia, CAD and Sjogren's syndrome . She has followed with [facility] since 10/18/2019  for consultative assistance with management of her hyperthyroidism.   HISTORICAL SUMMARY:  Pt presented to urgent care in 05/2019 for acute dyspnea, she was tachycardiac at that time, PE was ruled out but was noted to have a suppressed TSH . She was started on Methimazole at the time    TRAB was detectable but not elevated 10/2019 1.6 IU/L  Thyroid ultrasound 11/2019 no discrete nodules    No FH of thyroid disease   SUBJECTIVE:   Today (01/02/2023):  [name] is here for a follow up on hyperthyroidism.    She presented to the ED on 05/31/2022 for shortness of breath She was seen by ENT on 05/25/2022 for hoarseness, flexible laryngoscope shows dry nasal mucosa with old blood present throughout the nasal cavity bilaterally, hypopharynx shows no concerning lesions She has been having hoarseness for 3 months  that she attributes to budesonide inhaler   She has been noted with weight loss- intentional  Denies local neck swelling , she was on steroid inhalers which is causing dry throat ,has been off  Denies plapitations  Denies tremors  Denies  diarrhea , but has occasional constipation      MTX for Sjogren's syndrome  Methimazole 5 mg , 1 tablet three days ( Tuesday, Thursday and Saturday     HISTORY:  Past Medical History:  Past Medical History:  Diagnosis Date   Asthma    Coronary artery disease    Dyslipidemia    GERD (gastroesophageal reflux disease)    Hypertension    Immune deficiency disorder ([facility])    Past Surgical History:  Past  Surgical History:  Procedure Laterality Date   ANKLE SURGERY     Social History:  reports that she has never smoked. She has never used smokeless tobacco. She reports that she does not drink alcohol and does not use drugs. Family History:  Family History  Problem Relation Age of Onset   Hypertension Father    Diabetes Maternal Grandmother    Diabetes Paternal Grandmother    Cancer Paternal Grandmother      HOME MEDICATIONS: Allergies as of 01/02/2023       Reactions   Azithromycin Rash   Oropharyngeal Erythema with Odynophagia   Ibuprofen Hives   Penicillins Itching   Xanax [alprazolam] Itching        Medication List        Accurate as of January 02, 2023  6:56 AM. If you have any questions, ask your nurse or doctor.          BENADRYL ALLERGY PO Take by mouth.   folic acid 1 MG tablet Commonly known as: FOLVITE   hydroxychloroquine 200 MG tablet Commonly known as: PLAQUENIL Take 400 mg by mouth daily.   Iron 325 (65 Fe) MG Tabs 1 tablet   methimazole 5 MG tablet Commonly known as: TAPAZOLE Take 1 tablet (5 mg total) by mouth as directed. 1 tablet three times a week, none the rest of the week   methotrexate 2.5 MG tablet Commonly known as:  RHEUMATREX Take 30 mg by mouth once a week.   Ventolin HFA 108 (90 Base) MCG/ACT inhaler Generic drug: albuterol SMARTSIG:1-2 Puff(s) By Mouth Every 6 Hours PRN          OBJECTIVE:   PHYSICAL EXAM: VS: There were no vitals taken for this visit.   EXAM: General: Pt appears well and is in NAD  Neck: General: Supple without adenopathy. Thyroid: Thyroid gland is prominent  Lungs: Clear with good BS bilat with no rales, rhonchi, or wheezes  Heart: Auscultation: RRR.  Abdomen: Normoactive bowel sounds, soft, nontender, without masses or organomegaly palpable  Extremities:  BL LE: trace pretibial edema   Mental Status:  Mood and affect: No depression, anxiety, or agitation     DATA REVIEWED:    Latest  Reference Range & Units 01/02/23 09:12  TSH 0.35 - 5.50 uIU/mL 1.63  T4,Free(Direct) 0.60 - 1.60 ng/dL 2.95       Latest Reference Range & Units 06/24/21 09:38  ALDOSTERONE 0.0 - 30.0 ng/dL 2.7  Renin 6.213 - 0.865 ng/mL/hr 0.944  ALDOS/RENIN RATIO 0.0 - 30.0  2.9    ASSESSMENT / PLAN / RECOMMENDATIONS:   Hyperthyroidism:  - Most likely due to Graves' disease - No nodules on thyroid  ultrasound 11/2019 - Pt is clinically euthyroid  -TFTs remain within normal range, no changes at this time   Medications   Continue   methimazole 5 mg , 1 tablet three times weekly     Follow-up in 6 months      Signed electronically by: Lyndle Herrlich, MD  [facility]  [facility] [address] [place] [place], [place] [address] Phone: [phone] FAX: [phone]      CC: Lewis Moccasin, MD [address] [place] [address] Phone: [phone]  Fax: [phone]   Return to Endocrinology clinic as below: Future Appointments  Date Time Provider Department Center  01/02/2023  8:30 AM Lenya Sterne, Konrad Dolores, MD [id number] None

## 2023-01-18 DIAGNOSIS — J329 Chronic sinusitis, unspecified: Secondary | ICD-10-CM | POA: Diagnosis not present

## 2023-01-18 DIAGNOSIS — J069 Acute upper respiratory infection, unspecified: Secondary | ICD-10-CM | POA: Diagnosis not present

## 2023-01-18 DIAGNOSIS — B9689 Other specified bacterial agents as the cause of diseases classified elsewhere: Secondary | ICD-10-CM | POA: Diagnosis not present

## 2023-01-18 DIAGNOSIS — J45909 Unspecified asthma, uncomplicated: Secondary | ICD-10-CM | POA: Diagnosis not present

## 2023-03-08 DIAGNOSIS — Z13220 Encounter for screening for lipoid disorders: Secondary | ICD-10-CM | POA: Diagnosis not present

## 2023-03-08 DIAGNOSIS — E0590 Thyrotoxicosis, unspecified without thyrotoxic crisis or storm: Secondary | ICD-10-CM | POA: Diagnosis not present

## 2023-03-08 DIAGNOSIS — I10 Essential (primary) hypertension: Secondary | ICD-10-CM | POA: Diagnosis not present

## 2023-03-08 DIAGNOSIS — E782 Mixed hyperlipidemia: Secondary | ICD-10-CM | POA: Diagnosis not present

## 2023-03-14 ENCOUNTER — Other Ambulatory Visit: Payer: Self-pay | Admitting: Family Medicine

## 2023-03-14 DIAGNOSIS — Z1231 Encounter for screening mammogram for malignant neoplasm of breast: Secondary | ICD-10-CM

## 2023-03-14 DIAGNOSIS — D649 Anemia, unspecified: Secondary | ICD-10-CM | POA: Diagnosis not present

## 2023-03-14 DIAGNOSIS — Z0000 Encounter for general adult medical examination without abnormal findings: Secondary | ICD-10-CM | POA: Diagnosis not present

## 2023-03-27 DIAGNOSIS — Z124 Encounter for screening for malignant neoplasm of cervix: Secondary | ICD-10-CM | POA: Diagnosis not present

## 2023-03-27 DIAGNOSIS — Z01419 Encounter for gynecological examination (general) (routine) without abnormal findings: Secondary | ICD-10-CM | POA: Diagnosis not present

## 2023-03-27 DIAGNOSIS — Z0000 Encounter for general adult medical examination without abnormal findings: Secondary | ICD-10-CM | POA: Diagnosis not present

## 2023-03-27 DIAGNOSIS — Z118 Encounter for screening for other infectious and parasitic diseases: Secondary | ICD-10-CM | POA: Diagnosis not present

## 2023-03-28 DIAGNOSIS — H02849 Edema of unspecified eye, unspecified eyelid: Secondary | ICD-10-CM | POA: Diagnosis not present

## 2023-03-28 DIAGNOSIS — R590 Localized enlarged lymph nodes: Secondary | ICD-10-CM | POA: Diagnosis not present

## 2023-03-28 DIAGNOSIS — R5382 Chronic fatigue, unspecified: Secondary | ICD-10-CM | POA: Diagnosis not present

## 2023-03-28 DIAGNOSIS — D8989 Other specified disorders involving the immune mechanism, not elsewhere classified: Secondary | ICD-10-CM | POA: Diagnosis not present

## 2023-04-14 ENCOUNTER — Ambulatory Visit: Payer: BC Managed Care – PPO

## 2023-04-21 ENCOUNTER — Ambulatory Visit
Admission: RE | Admit: 2023-04-21 | Discharge: 2023-04-21 | Disposition: A | Discharge status: Home / Self Care | Payer: BC Managed Care – PPO | Source: Ambulatory Visit | Attending: Family Medicine | Admitting: Family Medicine

## 2023-04-21 DIAGNOSIS — Z1231 Encounter for screening mammogram for malignant neoplasm of breast: Secondary | ICD-10-CM

## 2023-04-26 ENCOUNTER — Other Ambulatory Visit: Payer: Self-pay | Admitting: Family Medicine

## 2023-04-26 DIAGNOSIS — R928 Other abnormal and inconclusive findings on diagnostic imaging of breast: Secondary | ICD-10-CM

## 2023-05-09 ENCOUNTER — Ambulatory Visit: Payer: BC Managed Care – PPO | Admitting: Primary Care

## 2023-05-09 ENCOUNTER — Encounter: Payer: Self-pay | Admitting: Primary Care

## 2023-05-09 VITALS — BP 122/78 | HR 89 | Temp 96.9°F | Ht 67.0 in | Wt 211.8 lb

## 2023-05-09 DIAGNOSIS — R490 Dysphonia: Secondary | ICD-10-CM

## 2023-05-09 DIAGNOSIS — R051 Acute cough: Secondary | ICD-10-CM

## 2023-05-09 DIAGNOSIS — J45991 Cough variant asthma: Secondary | ICD-10-CM | POA: Diagnosis not present

## 2023-05-09 LAB — POCT EXHALED NITRIC OXIDE: FeNO level (ppb): 12

## 2023-05-09 MED ORDER — PROMETHAZINE-DM 6.25-15 MG/5ML PO SYRP: 5.0000 mL | ORAL_SOLUTION | Freq: Four times a day (QID) | ORAL | 0 refills | Status: DC | PRN

## 2023-05-09 MED ORDER — PREDNISONE 10 MG PO TABS: ORAL_TABLET | ORAL | 0 refills | Status: AC

## 2023-05-09 MED ORDER — SALINE SPRAY 0.65 % NA SOLN: 2.0000 | Freq: Two times a day (BID) | NASAL | 2 refills | Status: DC

## 2023-05-09 NOTE — Patient Instructions (Addendum)
-  CHRONIC HOARSENESS: Chronic hoarseness means your voice has been persistently hoarse for a long time. We will refer you to an ENT specialist (Dr. Irene Pap) for further evaluation and potential treatments. In the meantime, try using a saline nasal spray to help with nasal dryness, Promethazine DM to suppress your cough, and sugar-free hard candies and sips of water to prevent coughing and allow your vocal cords to heal.  -COUGH VARIANT ASTHMA: Cough variant asthma is a type of asthma where the main symptom is a dry, non-productive cough. Continue using your Albuterol inhaler as needed for coughing fits. We may consider another round of Prednisone, starting at 50mg  for two days and then tapering down. We will also check FENO to assess your asthma control without steroids.  -EPISTAXIS: Epistaxis means nosebleeds. Your daily nosebleeds may be related to Sjogren's syndrome and nasal dryness. Avoid medicated nasal sprays as they can increase bleeding. Try using a saline nasal spray to help with nasal dryness.  INSTRUCTIONS:  Please follow up with the ENT specialist as soon as possible for further evaluation of your hoarseness. Continue using your Albuterol inhaler as needed and start the saline nasal spray and Promethazine DM as discussed. If you experience any worsening of symptoms or new issues, please contact our office.

## 2023-05-09 NOTE — Progress Notes (Signed)
@Patient  ID: Brittney Bass, female    DOB: 04-Jan-1981, 42 y.o.   MRN: [id number]  Chief Complaint  Patient presents with   Follow-up    Referring provider: Lewis Moccasin, MD  HPI: 42 year old female, never smoked. PMH significant for moderate persistent asthma, hypothyroidism, connective tissue disorder.  05/09/2023 Discussed the use of AI scribe software for clinical note transcription with the patient, who gave verbal consent to proceed.  History of Present Illness   The patient, with a history of asthma, presents with persistent hoarseness and coughing for over a year. She attributes these symptoms to the use of a budesonide inhaler, which she discontinued due to the onset of hoarseness. Despite discontinuation, the hoarseness persisted. The patient reports periods of clearer speech after coughing up dried mucus, particularly in the morning after using a humidifier. However, the hoarseness returns after a few hours.  The patient also reports occasional reflux, but does not believe it is the cause of her hoarseness. She has tried acid reflux medication without any improvement in symptoms and discontinued it due to side effects of diarrhea.  The patient has seen an ear, nose, and throat doctor who performed a laryngoscopy and found no visible damage or lesions. She has not been back to see this specialist since her initial visit.  The patient also has a history of Sjogren's syndrome and experiences daily nosebleeds, which she attributes to the dryness caused by this condition. She avoids using nasal sprays due to the risk of exacerbating the nosebleeds.  The patient takes prednisone occasionally for her autoimmune condition, which she reports helps significantly with her symptoms. However, she is aware of the risks associated with long-term prednisone use. She also uses an albuterol inhaler during coughing fits, which are usually triggered by the need to clear mucus from her throat.        Allergies  Allergen Reactions   Azithromycin Rash    Oropharyngeal Erythema with Odynophagia   Ibuprofen Hives   Penicillins Itching   Xanax [Alprazolam] Itching    Immunization History  Administered Date(s) Administered   DTaP 08/06/2015   Moderna Sars-Covid-2 Vaccination 02/21/2020    Past Medical History:  Diagnosis Date   Asthma    Coronary artery disease    Dyslipidemia    GERD (gastroesophageal reflux disease)    Hypertension    Immune deficiency disorder (HCC)     Tobacco History: Social History   Tobacco Use  Smoking Status Never  Smokeless Tobacco Never   Counseling given: Not Answered   Outpatient Medications Prior to Visit  Medication Sig Dispense Refill   diphenhydrAMINE HCl (BENADRYL ALLERGY PO) Take by mouth.     Ferrous Sulfate (IRON) 325 (65 Fe) MG TABS 1 tablet     folic acid (FOLVITE) 1 MG tablet   10   hydroxychloroquine (PLAQUENIL) 200 MG tablet Take 400 mg by mouth daily.     methimazole (TAPAZOLE) 5 MG tablet Take 1 tablet (5 mg total) by mouth as directed. 1 tablet three times a week, none the rest of the week 39 tablet 3   methotrexate (RHEUMATREX) 2.5 MG tablet Take 30 mg by mouth once a week.  1   VENTOLIN HFA 108 (90 Base) MCG/ACT inhaler SMARTSIG:1-2 Puff(s) By Mouth Every 6 Hours PRN     No facility-administered medications prior to visit.      Review of Systems  Review of Systems  Constitutional: Negative.   HENT:  Positive for congestion and postnasal  drip.   Respiratory:  Positive for cough. Negative for chest tightness, shortness of breath and wheezing.      Physical Exam  BP 122/78 (BP Location: Left Arm, Patient Position: Sitting, Cuff Size: Normal)   Pulse 89   Temp (!) 96.9 F (36.1 C) (Temporal)   Ht 5\' 7"  (1.702 m)   Wt 211 lb 12.8 oz (96.1 kg)   LMP  (LMP Unknown)   SpO2 100%   BMI 33.17 kg/m  Physical Exam Constitutional:      Appearance: Normal appearance.  HENT:     Head: Normocephalic and  atraumatic.     Mouth/Throat:     Mouth: Mucous membranes are moist.     Pharynx: Oropharynx is clear.  Cardiovascular:     Rate and Rhythm: Normal rate and regular rhythm.  Pulmonary:     Effort: Pulmonary effort is normal.     Breath sounds: Normal breath sounds.  Neurological:     General: No focal deficit present.     Mental Status: She is alert and oriented to person, place, and time. Mental status is at baseline.  Psychiatric:        Mood and Affect: Mood normal.        Behavior: Behavior normal.        Thought Content: Thought content normal.        Judgment: Judgment normal.      Lab Results:  CBC    Component Value Date/Time   WBC 7.3 05/31/2022 0838   RBC 3.64 (L) 05/31/2022 0838   HGB 10.2 (L) 05/31/2022 0838   HCT 31.4 (L) 05/31/2022 0838   PLT 401 (H) 05/31/2022 0838   MCV 86.3 05/31/2022 0838   MCH 28.0 05/31/2022 0838   MCHC 32.5 05/31/2022 0838   RDW 13.5 05/31/2022 0838   LYMPHSABS 0.8 10/18/2019 1213   MONOABS 0.5 10/18/2019 1213   EOSABS 1.0 (H) 10/18/2019 1213   BASOSABS 0.1 10/18/2019 1213    BMET    Component Value Date/Time   NA 132 (L) 05/31/2022 0838   K 3.0 (L) 05/31/2022 0838   CL 103 05/31/2022 0838   CO2 22 05/31/2022 0838   GLUCOSE 157 (H) 05/31/2022 0838   BUN 5 (L) 05/31/2022 0838   CREATININE 1.07 (H) 05/31/2022 0838   CALCIUM 8.4 (L) 05/31/2022 0838   GFRNONAA >60 05/31/2022 0838   GFRAA >60 08/09/2017 1650    BNP No results found for: "BNP"  ProBNP No results found for: "PROBNP"  Imaging: MM 3D SCREENING MAMMOGRAM BILATERAL BREAST  Result Date: 04/25/2023 CLINICAL DATA:  Screening. EXAM: DIGITAL SCREENING BILATERAL MAMMOGRAM WITH TOMOSYNTHESIS AND CAD TECHNIQUE: Bilateral screening digital craniocaudal and mediolateral oblique mammograms were obtained. Bilateral screening digital breast tomosynthesis was performed. The images were evaluated with computer-aided detection. COMPARISON:  None. ACR Breast Density Category  b: There are scattered areas of fibroglandular density. FINDINGS: In the left breast, a possible asymmetry warrants further evaluation. In the right breast, no findings suspicious for malignancy. IMPRESSION: Further evaluation is suggested for possible asymmetry in the left breast. RECOMMENDATION: Diagnostic mammogram and possibly ultrasound of the left breast. (Code:[id number]) The patient will be contacted regarding the findings, and additional imaging will be scheduled. BI-RADS CATEGORY  0: Incomplete: Need additional imaging evaluation. Electronically Signed   By: Elberta Fortis M.D.   On: 04/25/2023 12:06     Assessment & Plan:   1. Voice hoarseness - Ambulatory referral to ENT  2. Acute cough - POCT EXHALED NITRIC OXIDE  Chronic Hoarseness Persistent hoarseness despite discontinuation of Budesonide inhaler. No improvement with acid reflux medication.  -Refer to ENT specialist for further evaluation and potential interventions -Try saline nasal spray to help with nasal dryness  -Try Promethazine DM to suppress cough -Advised patient use sugar-free hard candies and sips of water to prevent coughing and allow vocal cords to heal.  Cough Variant Asthma Intolerance to inhaled steroids but does respond temporarily to oral prednisone. Coughing primarily occurs when trying to clear throat. -Continue use of Albuterol inhaler as needed for coughing fits. -RX Prednisone, starting at 50mg  for two days then tapering down -Checking FENO (12)  Epistaxis Daily nosebleeds, potentially related to Sjogren's syndrome and nasal dryness -Avoid medicated nasal sprays due to risk of increased bleeding -Try saline nasal spray to help with nasal dryness     Glenford Bayley, NP 05/09/2023

## 2023-05-15 ENCOUNTER — Encounter (INDEPENDENT_AMBULATORY_CARE_PROVIDER_SITE_OTHER): Payer: Self-pay | Admitting: Otolaryngology

## 2023-05-15 ENCOUNTER — Ambulatory Visit
Admission: RE | Admit: 2023-05-15 | Discharge: 2023-05-15 | Disposition: A | Discharge status: Home / Self Care | Payer: BC Managed Care – PPO | Source: Ambulatory Visit | Attending: Family Medicine | Admitting: Family Medicine

## 2023-05-15 ENCOUNTER — Ambulatory Visit: Payer: BC Managed Care – PPO

## 2023-05-15 DIAGNOSIS — R928 Other abnormal and inconclusive findings on diagnostic imaging of breast: Secondary | ICD-10-CM

## 2023-06-12 ENCOUNTER — Ambulatory Visit (INDEPENDENT_AMBULATORY_CARE_PROVIDER_SITE_OTHER): Payer: BC Managed Care – PPO | Admitting: Otolaryngology

## 2023-06-12 ENCOUNTER — Other Ambulatory Visit (HOSPITAL_COMMUNITY)
Admission: RE | Admit: 2023-06-12 | Discharge: 2023-06-12 | Disposition: A | Discharge status: Home / Self Care | Payer: BC Managed Care – PPO | Source: Ambulatory Visit | Attending: Otolaryngology | Admitting: Otolaryngology

## 2023-06-12 ENCOUNTER — Encounter (INDEPENDENT_AMBULATORY_CARE_PROVIDER_SITE_OTHER): Payer: Self-pay | Admitting: Otolaryngology

## 2023-06-12 VITALS — BP 118/79 | HR 113 | Ht 67.0 in | Wt 210.0 lb

## 2023-06-12 DIAGNOSIS — J329 Chronic sinusitis, unspecified: Secondary | ICD-10-CM | POA: Insufficient documentation

## 2023-06-12 DIAGNOSIS — R040 Epistaxis: Secondary | ICD-10-CM

## 2023-06-12 DIAGNOSIS — J3089 Other allergic rhinitis: Secondary | ICD-10-CM

## 2023-06-12 DIAGNOSIS — R490 Dysphonia: Secondary | ICD-10-CM | POA: Diagnosis not present

## 2023-06-12 DIAGNOSIS — R053 Chronic cough: Secondary | ICD-10-CM

## 2023-06-12 DIAGNOSIS — J370 Chronic laryngitis: Secondary | ICD-10-CM

## 2023-06-12 DIAGNOSIS — K219 Gastro-esophageal reflux disease without esophagitis: Secondary | ICD-10-CM | POA: Diagnosis not present

## 2023-06-12 DIAGNOSIS — R0982 Postnasal drip: Secondary | ICD-10-CM

## 2023-06-12 MED ORDER — DOXYCYCLINE HYCLATE 100 MG PO TABS: 100.0000 mg | ORAL_TABLET | Freq: Two times a day (BID) | ORAL | 0 refills | Status: AC

## 2023-06-12 NOTE — Progress Notes (Signed)
 ENT CONSULT:  Reason for Consult: chronic dysphonia and chronic cough    HPI:  Discussed the use of AI scribe software for clinical note transcription with the patient, who gave verbal consent to proceed.  History of Present Illness   The patient is a 43 yoF with hx of Sjogren's syndrome, who presents with persistent hoarseness and throat discomfort, which they attribute to the use of a budesonide  inhaler. The patient reports that the hoarseness began in October 2023, approximately six months after starting the inhaler due to coughing issues. Despite discontinuing the inhaler, the hoarseness has persisted. The patient denies any breathing issues and has a history of intermittent asthma treatment, including Singulair  and Breo, both of which were eventually discontinued due to side effects and symptom resolution, respectively.  The patient is followed by Rheumatology, and her Sjogren's sy is managed with methotrexate. She reports dry mouth and nose, attributed to Sjogren's syndrome. They have been on Plaquenil for eight years for the same. The patient notes that the methotrexate causes oral lesions and discomfort, including mouth sores and gum recession. They also report daily nosebleeds, which they attribute to thin nasal mucosa. The patient has been managing these symptoms with Vaseline and triple antibiotic ointment application.  The patient has a history of allergies, as indicated by high eosinophil counts, but denies having allergy  symptoms. They have not been on allergy  shots but have been using Zyrtec to manage postnasal drainage. The patient also reports a significant amount of crusting in the nose and throat, which they manage by clearing multiple times a day.  The patient has a history of two ankle surgeries in their twenties and has arthritis in the ankle. They deny any other known arthritis and report that their joint pain has been well controlled since starting methotrexate. The patient's  kidney function is reportedly normal, and their rheumatologist monitors their lab work every three to six months.  The patient has had multiple consultations with various specialists, including a rheumatologist, allergist, and pulmonologist. They have also had a previous ENT consultation, during which a scope was run down their nose. The ENT found no lesions but noted a lot of bleeding and dry blood. The patient was started on acid reflux medication following this consultation, which they took for several months without any improvement in their symptoms. The patient has also had multiple chest x-rays, all of which were reported as normal.     Records Reviewed:  Seen for URI 01/18/23  05/09/23 Brittney Ferrari NP Pulmonary  HPI: 43 year old female, never smoked. PMH significant for moderate persistent asthma, hypothyroidism, connective tissue disorder.   05/09/2023 Discussed the use of AI scribe software for clinical note transcription with the patient, who gave verbal consent to proceed.   History of Present Illness   The patient, with a history of asthma, presents with persistent hoarseness and coughing for over a year. She attributes these symptoms to the use of a budesonide  inhaler, which she discontinued due to the onset of hoarseness. Despite discontinuation, the hoarseness persisted. The patient reports periods of clearer speech after coughing up dried mucus, particularly in the morning after using a humidifier. However, the hoarseness returns after a few hours.   The patient also reports occasional reflux, but does not believe it is the cause of her hoarseness. She has tried acid reflux medication without any improvement in symptoms and discontinued it due to side effects of diarrhea.   The patient has seen an ear, nose, and throat doctor  who performed a laryngoscopy and found no visible damage or lesions. She has not been back to see this specialist since her initial visit.   The patient  also has a history of Sjogren's syndrome and experiences daily nosebleeds, which she attributes to the dryness caused by this condition. She avoids using nasal sprays due to the risk of exacerbating the nosebleeds.   The patient takes prednisone  occasionally for her autoimmune condition, which she reports helps significantly with her symptoms. However, she is aware of the risks associated with long-term prednisone  use. She also uses an albuterol  inhaler during coughing fits, which are usually triggered by the need to clear mucus from her throat.     Chronic Hoarseness Persistent hoarseness despite discontinuation of Budesonide  inhaler. No improvement with acid reflux medication.  -Refer to ENT specialist for further evaluation and potential interventions -Try saline nasal spray to help with nasal dryness  -Try Promethazine  DM to suppress cough -Advised patient use sugar-free hard candies and sips of water to prevent coughing and allow vocal cords to heal.   Cough Variant Asthma Intolerance to inhaled steroids but does respond temporarily to oral prednisone . Coughing primarily occurs when trying to clear throat. -Continue use of Albuterol  inhaler as needed for coughing fits. -RX Prednisone , starting at 50mg  for two days then tapering down -Checking FENO (12)   Epistaxis Daily nosebleeds, potentially related to Sjogren's syndrome and nasal dryness -Avoid medicated nasal sprays due to risk of increased bleeding -Try saline nasal spray to help with nasal dryness    05/25/22 Dr Edsel, ENT Hoarseness. 18-month history. Known history of Sjogren's and frequent epistaxis. Non-smoker. Has tried steroids without improvement. EXAM shows a very raspy voice. No audible stridor. Oral cavity oropharynx shows some irritated lesions of the oral mucosa that she says are chronic. Indirect laryngoscopy was difficult due to strong gag reflex. Neck without mass or adenopathy. FLEXIBLE LARYNGOSCOPY shows very  dry nasal mucosa with old blood present throughout the nasal cavity bilaterally. Nasopharynx with old blood and dryness. Hypopharynx shows no concerning lesions. Supraglottic larynx and larynx show no lesions but irritation of the vocal cords like seen in the oropharynx. Some old blood is on the endolarynx as well but nothing blocking her airway. PLAN: Reassured I do not see any lesions. Could be some related to the dryness that she has and the methotrexate. Could be some related to reflux. Agree with everything that is been done. Will add a diagnostic course of omeprazole that she will stop after symptoms improved. She is subjectively improving. Call if she does not continue to improve. Otherwise follow-up as needed. Electronically signed by Georgina Alm Edsel Mickey., MD at 05/25/2022 12:22 PM EST   Past Medical History:  Diagnosis Date   Asthma    Coronary artery disease    Dyslipidemia    GERD (gastroesophageal reflux disease)    Hypertension    Immune deficiency disorder [facility])     Past Surgical History:  Procedure Laterality Date   ANKLE SURGERY      Family History  Problem Relation Age of Onset   Hypertension Father    Diabetes Maternal Grandmother    Diabetes Paternal Grandmother    Cancer Paternal Grandmother     Social History:  reports that she has never smoked. She has never used smokeless tobacco. She reports that she does not drink alcohol and does not use drugs.  Allergies:  Allergies  Allergen Reactions   Azithromycin Rash    Oropharyngeal Erythema with Odynophagia  Ibuprofen Hives   Penicillamine Other (See Comments)   Penicillins Itching   Xanax [Alprazolam] Itching    Medications: I have reviewed the patient's current medications.  The PMH, PSH, Medications, Allergies, and SH were reviewed and updated.  ROS: Constitutional: Negative for fever, weight loss and weight gain. Cardiovascular: Negative for chest pain and dyspnea on exertion. Respiratory: Is  not experiencing shortness of breath at rest. Gastrointestinal: Negative for nausea and vomiting. Neurological: Negative for headaches. Psychiatric: The patient is not nervous/anxious  Blood pressure 118/79, pulse (!) 113, height 5' 7 (1.702 m), weight 210 lb (95.3 kg), SpO2 100%.  PHYSICAL EXAM:  Exam: General: Well-developed, well-nourished Communication and Voice: Raspy Respiratory Respiratory effort: Equal inspiration and expiration without stridor Cardiovascular Peripheral Vascular: Warm extremities with equal color/perfusion Eyes: No nystagmus with equal extraocular motion bilaterally Neuro/Psych/Balance: Patient oriented to person, place, and time; Appropriate mood and affect; Gait is intact with no imbalance; Cranial nerves I-XII are intact Head and Face Inspection: Normocephalic and atraumatic without mass or lesion Palpation: Facial skeleton intact without bony stepoffs Salivary Glands: No mass or tenderness Facial Strength: Facial motility symmetric and full bilaterally ENT Pinna: External ear intact and fully developed External canal: Canal is patent with intact skin Tympanic Membrane: Clear and mobile External Nose: No scar or anatomic deformity Internal Nose: Septum is relatively straight. Extensive crusting dry blood and synechiae b/l nasal passages, no active bleeding and no polyps or masses.  Lips, Teeth, and gums: Mucosa and teeth intact and viable TMJ: No pain to palpation with full mobility Oral cavity/oropharynx: mucosal changes (tan like discoloration in the oropharynx, chronic per patient) Nasopharynx: No mass or lesion dry blood and crusting present Hypopharynx: Intact mucosa without pooling of secretions Larynx Glottic: Full true vocal cord mobility without lesion or mass, evidence of extensive crusting along the surface of the true vocal folds and along the undersurface of the VF and is subglottic Supraglottic: Normal appearing epiglottis and AE  folds Interarytenoid Space: Moderate pachydermia&edema Subglottic Space: Patent without lesion or edema, no subglottic scar or narrowing Neck Neck and Trachea: Midline trachea without mass or lesion Thyroid : No mass or nodularity Lymphatics: No lymphadenopathy  Procedure:  Preoperative diagnosis: hoarseness  Postoperative diagnosis:   same  Procedure: Flexible fiberoptic laryngoscopy with stroboscopy (68420)  Surgeon: Shirell Struthers, MD  Anesthesia: Topical lidocaine and Afrin  Complications: None  Condition is stable throughout exam  Indications and consent:   The patient presents to the clinic with hoarseness. All the risks, benefits, and potential complications were reviewed with the patient preoperatively and informed verbal consent was obtained.  Procedure: The patient was seated upright in the exam chair.   Topical lidocaine and Afrin were applied to the nasal cavity. After adequate anesthesia had occurred, the flexible telescope was passed into the nasal cavity. The nasopharynx was patent without mass or lesion. The scope was passed behind the soft palate and directed toward the base of tongue. The base of tongue was visualized and was symmetric with no apparent masses or abnormal appearing tissue. There were no signs of a mass or pooling of secretions in the piriform sinuses. The supraglottic structures were normal.  The true vocal cords are mobile. The medial edges were with severe crusting. Closure was incomplete. Periodicity present. The mucosal wave and amplitude were slightly decreased. There is moderate interarytenoid pachydermia and post cricoid edema. The mucosa appears dry with crusting, but no lesions.   The laryngoscope was then slowly withdrawn and the patient tolerated the  procedure well. There were no complications or blood loss.  PROCEDURE NOTE: nasal endoscopy  Preoperative diagnosis: chronic sinusitis symptoms and hx of Sjogren's  Postoperative diagnosis:  same  Procedure: Diagnostic nasal endoscopy (68768)  Surgeon: Brittney Bass, M.D.  Anesthesia: Topical lidocaine and Afrin  H&P REVIEW: The patient's history and physical were reviewed today prior to procedure. All medications were reviewed and updated as well. Complications: None Condition is stable throughout exam Indications and consent: The patient presents with symptoms of chronic sinusitis not responding to previous therapies. All the risks, benefits, and potential complications were reviewed with the patient preoperatively and informed consent was obtained. The time out was completed with confirmation of the correct procedure.   Procedure: The patient was seated upright in the clinic. Topical lidocaine and Afrin were applied to the nasal cavity. After adequate anesthesia had occurred, the rigid nasal endoscope was passed into the nasal cavity. The nasal mucosa, turbinates, septum, and sinus drainage pathways were visualized bilaterally. This revealed no purulence or significant secretions that might be cultured. There were no polyps or sites of significant inflammation. The mucosa was intact and there was extensive crusting present b/l nasal passages. The scope was then slowly withdrawn and the patient tolerated the procedure well. There were no complications or blood loss.  Studies Reviewed: CXR 05/31/2022 FINDINGS: The heart size and mediastinal contours are within normal limits. Both lungs are clear. The visualized skeletal structures are unremarkable.  Thyroid  U/S 12/03/19 IMPRESSION: 1. Enlarged thyroid  gland without evidence for distinct thyroid  nodule. 2. Symmetric enlarged submandibular lymph nodes as detailed above. The cortex of both these lymph nodes is thickened, however the both maintain a normal fatty hilum. These lymph nodes are of unknown clinical significance. Consider short-term interval follow-up ultrasound or biopsy as clinically indicated.    IMPRESSION: No active cardiopulmonary disease  Assessment/Plan: Encounter Diagnoses  Name Primary?   Dysphonia Yes   Chronic cough    Chronic GERD    Environmental and seasonal allergies    Post-nasal drip    Epistaxis    Chronic sinusitis, unspecified location    Chronic laryngitis    Laryngitis sicca     Assessment and Plan    Chronic Dysphonia Chronic Laryngitis and Laryngitis Sicca 2/2 Sjogren's Persistent hoarseness and throat issues since October 2023, reportedly exacerbated by budesonide  inhaler. Examination revealed extensive crusting throughout upper airway (bilateral nasal passages with synechiae and extensive crusting/dry blood, and true VF with significant crusting and chronic inflammation). Differential includes chronic inflammation due to autoimmune condition or infection or laryngitis sicca 2/2 Sjogren's. Discussed potential need for biopsy and culture under anesthesia if initial treatments fail.  - Swabbed nasal crusting for culture - will f/u results and abx sensitivities - Order sinus CT to rule out chronic sinusitis  - Prescribed doxycycline  for two weeks (she has abx allergies to sulfa and PNC and reports improvement in nasal congestion while on Doxy in the past) - Advised nasal rinses with salt water and baby shampoo - Follow up in six weeks  Chronic Rhinosinusitis Chronic nasal crusting, scarring, and epistaxis. Examination showed significant crusting and scarring in nasal passages on b/l nasal endoscopy. Differential includes chronic inflammation due to autoimmune condition or infection. Discussed potential need for biopsy in the future. Risks include persistent nasal obstruction, recurrent sinus infections, and chronic discomfort. Benefits include symptom relief and identification of underlying cause. Alternatives include referral to infectious disease if infection is confirmed - Swabbed nose for culture - f/u cx results - Ordered sinus  CT - Prescribed  doxycycline  100 mg BID x for two weeks - Advised nasal rinses with salt water and baby shampoo - Follow up in six weeks  Sjogren's Syndrome and upper airway manifestation including nasal scarring/crusting and laryngitis sicca Long-standing diagnosis with symptoms of dry mouth, dry nose, and joint pain. Currently managed with Plaquenil and methotrexate. No new systemic involvement noted.  - Continue Plaquenil and methotrexate - Consider follow-up with rheumatology if symptoms persist or worsen   Cough variant asthma Asthma managed with budesonide  inhaler, Singulair , and Breo in the past, not on any of these medications right now. Current symptoms do not include significant breathing issues. Benefits include prevention of exacerbations and improved quality of life. Alternatives include trial of different asthma medications if symptoms recur. - No immediate changes to asthma management  Follow-up - Follow up in six weeks - Send doxycycline  prescription to pharmacy - Hold off on steroids for now (just had a taper given by Pulm) - Consider culture and bx of in the future  - CT sinuses        Thank you for allowing me to participate in the care of this patient. Please do not hesitate to contact me with any questions or concerns.   Brittney Larry, MD Otolaryngology [facility] Phone: [phone] Fax: [phone]    06/13/2023, 12:12 PM

## 2023-06-12 NOTE — Patient Instructions (Signed)
 Brittney Bass Med Nasal Saline Rinse   - start nasal saline rinses with NeilMed Bottle available over the counter or online to help with nasal congestion

## 2023-06-17 LAB — AEROBIC/ANAEROBIC CULTURE W GRAM STAIN (SURGICAL/DEEP WOUND)

## 2023-06-30 DIAGNOSIS — D8989 Other specified disorders involving the immune mechanism, not elsewhere classified: Secondary | ICD-10-CM | POA: Diagnosis not present

## 2023-07-03 ENCOUNTER — Ambulatory Visit: Payer: BC Managed Care – PPO | Admitting: Internal Medicine

## 2023-07-12 LAB — FUNGUS CULTURE RESULT

## 2023-07-12 LAB — FUNGUS CULTURE WITH STAIN

## 2023-07-12 LAB — FUNGAL ORGANISM REFLEX

## 2023-07-24 ENCOUNTER — Ambulatory Visit (INDEPENDENT_AMBULATORY_CARE_PROVIDER_SITE_OTHER): Payer: BC Managed Care – PPO | Admitting: Otolaryngology

## 2023-07-24 ENCOUNTER — Encounter (INDEPENDENT_AMBULATORY_CARE_PROVIDER_SITE_OTHER): Payer: Self-pay | Admitting: Otolaryngology

## 2023-07-24 VITALS — BP 125/79 | HR 98

## 2023-07-24 DIAGNOSIS — J3089 Other allergic rhinitis: Secondary | ICD-10-CM

## 2023-07-24 DIAGNOSIS — M3500 Sicca syndrome, unspecified: Secondary | ICD-10-CM

## 2023-07-24 DIAGNOSIS — R053 Chronic cough: Secondary | ICD-10-CM | POA: Diagnosis not present

## 2023-07-24 DIAGNOSIS — J329 Chronic sinusitis, unspecified: Secondary | ICD-10-CM

## 2023-07-24 DIAGNOSIS — J370 Chronic laryngitis: Secondary | ICD-10-CM

## 2023-07-24 DIAGNOSIS — R040 Epistaxis: Secondary | ICD-10-CM

## 2023-07-24 DIAGNOSIS — R0982 Postnasal drip: Secondary | ICD-10-CM

## 2023-07-24 DIAGNOSIS — K219 Gastro-esophageal reflux disease without esophagitis: Secondary | ICD-10-CM | POA: Diagnosis not present

## 2023-07-24 DIAGNOSIS — R0981 Nasal congestion: Secondary | ICD-10-CM

## 2023-07-24 DIAGNOSIS — R490 Dysphonia: Secondary | ICD-10-CM | POA: Diagnosis not present

## 2023-07-24 MED ORDER — FAMOTIDINE 20 MG PO TABS: 20.0000 mg | ORAL_TABLET | Freq: Two times a day (BID) | ORAL | 3 refills | Status: DC

## 2023-07-24 NOTE — Patient Instructions (Addendum)
 Lloyd Huger Med Nasal Saline Rinse   - start nasal saline rinses with NeilMed Bottle available over the counter or online to help with nasal congestion     [email or web address] - check out this website to learn more about reflux   -Avoid lying down for at least two hours after a meal or after drinking acidic beverages, like soda, or other caffeinated beverages. This can help to prevent stomach contents from flowing back into the esophagus. -Keep your head elevated while you sleep. Using an extra pillow or two can also help to prevent reflux. -Eat smaller and more frequent meals each day instead of a few large meals. This promotes digestion and can aid in preventing heartburn. -Wear loose-fitting clothes to ease pressure on the stomach, which can worsen heartburn and reflux. -Reduce excess weight around the midsection. This can ease pressure on the stomach. Such pressure can force some stomach contents back up the esophagus  - Take Reflux Gourmet (natural supplement available on Amazon) to help with symptoms of chronic throat irritation

## 2023-07-24 NOTE — Progress Notes (Signed)
ENT Progress Note:   Update 07/24/2023  Discussed the use of AI scribe software for clinical note transcription with the patient, who gave verbal consent to proceed.  History of Present Illness   Brittney Bass is a 43 year old female with Sjogren's syndrome who presents for f/u with chronic cough throat and nasal dryness.  She experiences chronic cough and throat dryness, with raspy voice and throat discomfort, especially in dry conditions. There is a sensation of thick mucus in her throat when drinking water, requiring multiple sips for relief. She uses distilled water nasal rinses daily and salt water rinses twice a week, though the salt causes burning. Vaseline is applied for nasal dryness. No throat pain unless very dry, and hoarseness fluctuates with environmental conditions.  She has a history of Sjogren's syndrome, currently managed by Rheum. She has oral lesions have persisted for years, and she takes Zyrtec nightly. Nasal sprays are avoided due to epistaxis, opting instead for nasal rinses. Throat dryness affects her voice, making it hoarse but clear, and fluctuates with weather changes, necessitating a humidifier.  She has a history of reflux but is not currently on medication, though she has used omeprazole in the past. Certain foods and hard candy exacerbate symptoms, leading to throat dryness and hoarseness. Hives after last office visit, thinks she reacted to nasal spray. Was not able to schedule CT sinuses.      Records Reviewed:  Initial Evaluation  Reason for Consult: chronic dysphonia and chronic cough    HPI:  Discussed the use of AI scribe software for clinical note transcription with the patient, who gave verbal consent to proceed.  History of Present Illness   The patient is a 68 yoF with hx of Sjogren's syndrome, who presents with persistent hoarseness and throat discomfort, which they attribute to the use of a budesonide inhaler. The patient reports that the  hoarseness began in October 2023, approximately six months after starting the inhaler due to coughing issues. Despite discontinuing the inhaler, the hoarseness has persisted. The patient denies any breathing issues and has a history of intermittent asthma treatment, including Singulair and Breo, both of which were eventually discontinued due to side effects and symptom resolution, respectively.  The patient is followed by Rheumatology, and her Sjogren's sy is managed with methotrexate. She reports dry mouth and nose, attributed to Sjogren's syndrome. They have been on Plaquenil for eight years for the same. The patient notes that the methotrexate causes oral lesions and discomfort, including mouth sores and gum recession. They also report daily nosebleeds, which they attribute to thin nasal mucosa. The patient has been managing these symptoms with Vaseline and triple antibiotic ointment application.  The patient has a history of allergies, as indicated by high eosinophil counts, but denies having allergy symptoms. They have not been on allergy shots but have been using Zyrtec to manage postnasal drainage. The patient also reports a significant amount of crusting in the nose and throat, which they manage by clearing multiple times a day.  The patient has a history of two ankle surgeries in their twenties and has arthritis in the ankle. They deny any other known arthritis and report that their joint pain has been well controlled since starting methotrexate. The patient's kidney function is reportedly normal, and their rheumatologist monitors their lab work every three to six months.  The patient has had multiple consultations with various specialists, including a rheumatologist, allergist, and pulmonologist. They have also had a previous ENT consultation, during which  a scope was run down their nose. The ENT found no lesions but noted a lot of bleeding and dry blood. The patient was started on acid reflux  medication following this consultation, which they took for several months without any improvement in their symptoms. The patient has also had multiple chest x-rays, all of which were reported as normal.   Seen for URI 01/18/23  05/09/23 Ames Dura NP Pulmonary  HPI: 43 year old female, never smoked. PMH significant for moderate persistent asthma, hypothyroidism, connective tissue disorder.   05/09/2023 Discussed the use of AI scribe software for clinical note transcription with the patient, who gave verbal consent to proceed.   History of Present Illness   The patient, with a history of asthma, presents with persistent hoarseness and coughing for over a year. She attributes these symptoms to the use of a budesonide inhaler, which she discontinued due to the onset of hoarseness. Despite discontinuation, the hoarseness persisted. The patient reports periods of clearer speech after coughing up dried mucus, particularly in the morning after using a humidifier. However, the hoarseness returns after a few hours.   The patient also reports occasional reflux, but does not believe it is the cause of her hoarseness. She has tried acid reflux medication without any improvement in symptoms and discontinued it due to side effects of diarrhea.   The patient has seen an ear, nose, and throat doctor who performed a laryngoscopy and found no visible damage or lesions. She has not been back to see this specialist since her initial visit.   The patient also has a history of Sjogren's syndrome and experiences daily nosebleeds, which she attributes to the dryness caused by this condition. She avoids using nasal sprays due to the risk of exacerbating the nosebleeds.   The patient takes prednisone occasionally for her autoimmune condition, which she reports helps significantly with her symptoms. However, she is aware of the risks associated with long-term prednisone use. She also uses an albuterol inhaler during  coughing fits, which are usually triggered by the need to clear mucus from her throat.     Chronic Hoarseness Persistent hoarseness despite discontinuation of Budesonide inhaler. No improvement with acid reflux medication.  -Refer to ENT specialist for further evaluation and potential interventions -Try saline nasal spray to help with nasal dryness  -Try Promethazine DM to suppress cough -Advised patient use sugar-free hard candies and sips of water to prevent coughing and allow vocal cords to heal.   Cough Variant Asthma Intolerance to inhaled steroids but does respond temporarily to oral prednisone. Coughing primarily occurs when trying to clear throat. -Continue use of Albuterol inhaler as needed for coughing fits. -RX Prednisone, starting at 50mg  for two days then tapering down -Checking FENO (12)   Epistaxis Daily nosebleeds, potentially related to Sjogren's syndrome and nasal dryness -Avoid medicated nasal sprays due to risk of increased bleeding -Try saline nasal spray to help with nasal dryness    05/25/22 Dr Emelda Fear, ENT Hoarseness. 32-month history. Known history of Sjogren's and frequent epistaxis. Non-smoker. Has tried steroids without improvement. EXAM shows a very raspy voice. No audible stridor. Oral cavity oropharynx shows some irritated lesions of the oral mucosa that she says are chronic. Indirect laryngoscopy was difficult due to strong gag reflex. Neck without mass or adenopathy. FLEXIBLE LARYNGOSCOPY shows very dry nasal mucosa with old blood present throughout the nasal cavity bilaterally. Nasopharynx with old blood and dryness. Hypopharynx shows no concerning lesions. Supraglottic larynx and larynx show no lesions but  irritation of the vocal cords like seen in the oropharynx. Some old blood is on the endolarynx as well but nothing blocking her airway. PLAN: Reassured I do not see any lesions. Could be some related to the dryness that she has and the methotrexate.  Could be some related to reflux. Agree with everything that is been done. Will add a diagnostic course of omeprazole that she will stop after symptoms improved. She is subjectively improving. Call if she does not continue to improve. Otherwise follow-up as needed. Electronically signed by Elyn Peers., MD at 05/25/2022 12:22 PM EST   Past Medical History:  Diagnosis Date   Asthma    Coronary artery disease    Dyslipidemia    GERD (gastroesophageal reflux disease)    Hypertension    Immune deficiency disorder [facility])     Past Surgical History:  Procedure Laterality Date   ANKLE SURGERY      Family History  Problem Relation Age of Onset   Hypertension Father    Diabetes Maternal Grandmother    Diabetes Paternal Grandmother    Cancer Paternal Grandmother     Social History:  reports that she has never smoked. She has never used smokeless tobacco. She reports that she does not drink alcohol and does not use drugs.  Allergies:  Allergies  Allergen Reactions   Azithromycin Rash    Oropharyngeal Erythema with Odynophagia   Ibuprofen Hives   Penicillamine Other (See Comments)   Penicillins Itching   Xanax [Alprazolam] Itching    Medications: I have reviewed the patient's current medications.  The PMH, PSH, Medications, Allergies, and SH were reviewed and updated.  ROS: Constitutional: Negative for fever, weight loss and weight gain. Cardiovascular: Negative for chest pain and dyspnea on exertion. Respiratory: Is not experiencing shortness of breath at rest. Gastrointestinal: Negative for nausea and vomiting. Neurological: Negative for headaches. Psychiatric: The patient is not nervous/anxious  Blood pressure 125/79, pulse 98, SpO2 99%.  PHYSICAL EXAM:  Exam: General: Well-developed, well-nourished Communication and Voice: Raspy Respiratory Respiratory effort: Equal inspiration and expiration without stridor Cardiovascular Peripheral Vascular: Warm  extremities with equal color/perfusion Eyes: No nystagmus with equal extraocular motion bilaterally Neuro/Psych/Balance: Patient oriented to person, place, and time; Appropriate mood and affect; Gait is intact with no imbalance; Cranial nerves I-XII are intact Head and Face Inspection: Normocephalic and atraumatic without mass or lesion Palpation: Facial skeleton intact without bony stepoffs Salivary Glands: No mass or tenderness Facial Strength: Facial motility symmetric and full bilaterally ENT Pinna: External ear intact and fully developed External canal: Canal is patent with intact skin Tympanic Membrane: Clear and mobile External Nose: No scar or anatomic deformity Internal Nose: Septum is relatively straight. Extensive crusting dry blood and synechiae b/l nasal passages, but amount of crusting decreased relative to last exam, no active bleeding and no polyps or masses.  Lips, Teeth, and gums: Mucosa and teeth intact and viable TMJ: No pain to palpation with full mobility Oral cavity/oropharynx: mucosal changes (tan like discoloration in the oropharynx, chronic per patient) Nasopharynx: No mass or lesion dry blood and crusting present Hypopharynx: Intact mucosa without pooling of secretions Larynx Glottic: Full true vocal cord mobility without lesion or mass, evidence of crusting along the surface of the true vocal folds and along the undersurface of the VF and subglottis Supraglottic: Normal appearing epiglottis and AE folds Interarytenoid Space: Moderate pachydermia&edema Subglottic Space: Patent without lesion or edema, no subglottic scar or narrowing Neck Neck and Trachea: Midline trachea without mass  or lesion Thyroid: No mass or nodularity Lymphatics: No lymphadenopathy  Procedure:  Preoperative diagnosis: hoarseness hx of Sjogren's  Postoperative diagnosis:   same + GERD LPR  Procedure: Flexible fiberoptic laryngoscopy   Surgeon: Ashok Croon, MD  Anesthesia:  Topical lidocaine and Afrin Complications: None Condition is stable throughout exam  Indications and consent:  The patient presents to the clinic with dysphonia.  Indirect laryngoscopy view was incomplete. Thus it was recommended that they undergo a flexible fiberoptic laryngoscopy. All of the risks, benefits, and potential complications were reviewed with the patient preoperatively and verbal informed consent was obtained.  Procedure: The patient was seated upright in the clinic. Topical lidocaine and Afrin were applied to the nasal cavity. After adequate anesthesia had occurred, I then proceeded to pass the flexible telescope into the nasal cavity. The nasal cavity was patent without rhinorrhea or polyp. The nasopharynx was also patent without mass or lesion. The base of tongue was visualized and was normal. There were no signs of pooling of secretions in the piriform sinuses. The true vocal folds were mobile bilaterally. There were no signs of glottic or supraglottic mucosal lesion or mass. There was moderate interarytenoid pachydermia and post cricoid edema. The telescope was then slowly withdrawn and the patient tolerated the procedure throughout.   PROCEDURE NOTE: nasal endoscopy  Preoperative diagnosis: chronic sinusitis symptoms and hx of Sjogren's  Postoperative diagnosis: same  Procedure: Diagnostic nasal endoscopy (16109)  Surgeon: Ashok Croon, M.D.  Anesthesia: Topical lidocaine and Afrin  H&P REVIEW: The patient's history and physical were reviewed today prior to procedure. All medications were reviewed and updated as well. Complications: None Condition is stable throughout exam Indications and consent: The patient presents with symptoms of chronic sinusitis not responding to previous therapies. All the risks, benefits, and potential complications were reviewed with the patient preoperatively and informed consent was obtained. The time out was completed with confirmation of  the correct procedure.   Procedure: The patient was seated upright in the clinic. Topical lidocaine and Afrin were applied to the nasal cavity. After adequate anesthesia had occurred, the rigid nasal endoscope was passed into the nasal cavity. The nasal mucosa, turbinates, septum, and sinus drainage pathways were visualized bilaterally. This revealed no purulence or significant secretions that might be cultured. There were no polyps or sites of significant inflammation. The mucosa was intact and there was extensive crusting present b/l nasal passages, improved from last exam. The scope was then slowly withdrawn and the patient tolerated the procedure well. There were no complications or blood loss.  Studies Reviewed: CXR 05/31/2022 FINDINGS: The heart size and mediastinal contours are within normal limits. Both lungs are clear. The visualized skeletal structures are unremarkable.  Thyroid U/S 12/03/19 IMPRESSION: 1. Enlarged thyroid gland without evidence for distinct thyroid nodule. 2. Symmetric enlarged submandibular lymph nodes as detailed above. The cortex of both these lymph nodes is thickened, however the both maintain a normal fatty hilum. These lymph nodes are of unknown clinical significance. Consider short-term interval follow-up ultrasound or biopsy as clinically indicated.   IMPRESSION: No active cardiopulmonary disease  Assessment/Plan: Encounter Diagnoses  Name Primary?   Chronic sinusitis, unspecified location Yes   Environmental and seasonal allergies    Dysphonia    Chronic cough    Post-nasal drip    Chronic laryngitis    Laryngitis sicca    Epistaxis    Chronic GERD      Assessment and Plan    Chronic Dysphonia Chronic Laryngitis and Laryngitis Sicca  2/2 Sjogren's Persistent hoarseness and throat issues since October 2023, reportedly exacerbated by budesonide inhaler. Examination revealed extensive crusting throughout upper airway (bilateral nasal passages  with synechiae and extensive crusting/dry blood, and true VF with significant crusting and chronic inflammation). Differential includes chronic inflammation due to autoimmune condition or infection or laryngitis sicca 2/2 Sjogren's. Discussed potential need for biopsy and culture under anesthesia if initial treatments fail.  - Swabbed nasal crusting for culture - will f/u results and abx sensitivities - Order sinus CT to rule out chronic sinusitis  - Prescribed doxycycline for two weeks (she has abx allergies to sulfa and PNC and reports improvement in nasal congestion while on Doxy in the past) - Advised nasal rinses with salt water and baby shampoo - Follow up in six weeks  Chronic Rhinosinusitis Chronic nasal crusting, scarring, and epistaxis. Examination showed significant crusting and scarring in nasal passages on b/l nasal endoscopy. Differential includes chronic inflammation due to autoimmune condition or infection. Discussed potential need for biopsy in the future. Risks include persistent nasal obstruction, recurrent sinus infections, and chronic discomfort. Benefits include symptom relief and identification of underlying cause. Alternatives include referral to infectious disease if infection is confirmed - Swabbed nose for culture - f/u cx results - Ordered sinus CT - Prescribed doxycycline 100 mg BID x for two weeks - Advised nasal rinses with salt water and baby shampoo - Follow up in six weeks  Sjogren's Syndrome and upper airway manifestation including nasal scarring/crusting and laryngitis sicca Long-standing diagnosis with symptoms of dry mouth, dry nose, and joint pain. Currently managed with Plaquenil and methotrexate. No new systemic involvement noted.  - Continue Plaquenil and methotrexate - Consider follow-up with rheumatology if symptoms persist or worsen   Cough variant asthma Asthma managed with budesonide inhaler, Singulair, and Breo in the past, not on any of these  medications right now. Current symptoms do not include significant breathing issues. Benefits include prevention of exacerbations and improved quality of life. Alternatives include trial of different asthma medications if symptoms recur. - No immediate changes to asthma management  Follow-up - Follow up in six weeks - Send doxycycline prescription to pharmacy - Hold off on steroids for now (just had a taper given by Pulm) - Consider culture and bx of in the future  - CT sinuses      Update 07/24/2023 Assessment and Plan    Chronic Cough Persistent chronic cough with raspy voice and throat dryness. Symptoms have improved. Examination revealed improved but persistent mucosal dryness, crusting, and blood-tinged secretions in the nasal passages and along the true vocal folds. No airway narrowing observed on scope exam today. Discussed nasal rinses with Neilmed bottle and salt packets, adding baby shampoo once or twice a week, and facial steaming vs hypertonic nebulizer treatments if able to get nebulizer machine from PCP to loosen crusting around the larynx to help cough this up. Recommended famotidine (Pepcid) for reflux management and Reflux Gourmet supplement.  - re-ordered CT scan of sinuses to rule out CRS - Recommend daily nasal rinses with Neilmed bottle and salt packets - Advise adding baby shampoo to nasal rinses once or twice a week - Suggest facial steaming to loosen throat secretions - Prescribe famotidine (Pepcid) 20 mg BID for reflux - Recommend Reflux Gourmet supplement after meals for reflux management - Discuss with rheumatologist potential changes in Sjogren's treatment regimen  Chronic nasal congestion concern for CRS - culture swab from nasal passages (+) S. Aureus s/p Doxycycline  - reports improved  sx and nasal endoscopy with reduced burden of nasal crusting  - continue nasal rinses  - will f/u CT sinus results   Epistaxis Recurrent low-volume epistaxis likely  secondary to nasal dryness and crusting. Examination shows blood-tinged secretions and dry blood in nasal passages. Advised continuing Vaseline in the nostrils and daily nasal rinses with distilled water and Neilmed bottle with salt packets. - Continue using Vaseline in the nostrils - Recommend daily nasal rinses with distilled water - Advised using Neilmed bottle with salt packets for nasal rinses  Chronic GERD LPR - Prescribe famotidine (Pepcid) 20 mg BID for reflux - Recommend Reflux Gourmet supplement after meals for reflux management  Sjogren's Syndrome Long-standing Sjogren's syndrome managed with methotrexate and hydroxychloroquine. Reports persistent oral lesions and throat dryness. Methotrexate may contribute to oral issues. Discussed potential alternative treatments with rheumatologist. - Continue current medications (methotrexate, hydroxychloroquine) - Discuss potential alternative treatments with rheumatologist  Follow-up - Schedule follow-up appointment in May - Ensure CT scan of sinuses is completed before next visit.         Ashok Croon, MD Otolaryngology [facility] Phone: [phone] Fax: [phone]    07/24/2023, 5:53 PM

## 2023-08-03 ENCOUNTER — Other Ambulatory Visit (INDEPENDENT_AMBULATORY_CARE_PROVIDER_SITE_OTHER): Payer: Self-pay | Admitting: Otolaryngology

## 2023-08-24 ENCOUNTER — Ambulatory Visit (INDEPENDENT_AMBULATORY_CARE_PROVIDER_SITE_OTHER): Payer: BC Managed Care – PPO | Admitting: Internal Medicine

## 2023-08-24 ENCOUNTER — Encounter: Payer: Self-pay | Admitting: Internal Medicine

## 2023-08-24 VITALS — BP 120/72 | HR 81 | Ht 67.0 in | Wt 214.0 lb

## 2023-08-24 DIAGNOSIS — E0590 Thyrotoxicosis, unspecified without thyrotoxic crisis or storm: Secondary | ICD-10-CM | POA: Diagnosis not present

## 2023-08-24 NOTE — Progress Notes (Unsigned)
 Name: Brittney Bass  MRN/ DOB: [id number], Aug 11, 1980    Age/ Sex: 43 y.o., female     PCP: Lewis Moccasin, MD   Reason for Endocrinology Evaluation: Hyperthyroidism     Initial Endocrinology Clinic Visit: 10/18/2019    PATIENT IDENTIFIER: Brittney Bass is a 43 y.o., female with a past medical history of HTN, dyslipidemia, CAD and Sjogren's syndrome . She has followed with [facility] since 10/18/2019  for consultative assistance with management of her hyperthyroidism.   HISTORICAL SUMMARY:  Pt presented to urgent care in 05/2019 for acute dyspnea, she was tachycardiac at that time, PE was ruled out but was noted to have a suppressed TSH . She was started on Methimazole at the time    TRAB was detectable but not elevated 10/2019 1.6 IU/L  Thyroid ultrasound 11/2019 no discrete nodules    No FH of thyroid disease   SUBJECTIVE:   Today (08/24/2023):  Brittney Bass is here for a follow up on hyperthyroidism.    She was seen by ENT on 05/25/2022 for hoarseness, flexible laryngoscope shows dry nasal mucosa with old blood present throughout the nasal cavity bilaterally, hypopharynx shows no concerning lesions   She continues to follow-up with rheumatology for Sjogren's syndrome She continues to follow-up with pulmonary for moderate persistent asthma   Weight has been stable  Denies local neck swelling  Denies plapitations  Denies tremors  NO change in bowel movements No eye symptoms    Methimazole 5 mg , 1 tablet three days ( Tuesday, Thursday and Saturday )    HISTORY:  Past Medical History:  Past Medical History:  Diagnosis Date   Asthma    Coronary artery disease    Dyslipidemia    GERD (gastroesophageal reflux disease)    Hypertension    Immune deficiency disorder (HCC)    Past Surgical History:  Past Surgical History:  Procedure Laterality Date   ANKLE SURGERY     Social History:  reports that she has never smoked. She has never used  smokeless tobacco. She reports that she does not drink alcohol and does not use drugs. Family History:  Family History  Problem Relation Age of Onset   Hypertension Father    Diabetes Maternal Grandmother    Diabetes Paternal Grandmother    Cancer Paternal Grandmother      HOME MEDICATIONS: Allergies as of 08/24/2023       Reactions   Azithromycin Rash   Oropharyngeal Erythema with Odynophagia   Ibuprofen Hives   Penicillamine Other (See Comments)   Penicillins Itching   Xanax [alprazolam] Itching        Medication List        Accurate as of August 24, 2023  7:40 AM. If you have any questions, ask your nurse or doctor.          STOP taking these medications    promethazine-dextromethorphan 6.25-15 MG/5ML syrup Commonly known as: PROMETHAZINE-DM Stopped by: Johnney Ou Trai Ells   sodium chloride 0.65 % Soln nasal spray Commonly known as: OCEAN Stopped by: Johnney Ou Miriah Maruyama       TAKE these medications    BENADRYL ALLERGY PO Take by mouth.   cetirizine 10 MG tablet Commonly known as: ZYRTEC Take 10 mg by mouth daily.   doxycycline 100 MG tablet Commonly known as: VIBRA-TABS Take 1 tablet (100 mg total) by mouth 2 (two) times daily.   EPIPEN IJ Inject as directed.   famotidine 20 MG tablet Commonly  known as: PEPCID TAKE 1 TABLET BY MOUTH TWICE A DAY   folic acid 1 MG tablet Commonly known as: FOLVITE   hydroxychloroquine 200 MG tablet Commonly known as: PLAQUENIL Take 400 mg by mouth daily.   Iron 325 (65 Fe) MG Tabs 1 tablet   methimazole 5 MG tablet Commonly known as: TAPAZOLE Take 1 tablet (5 mg total) by mouth as directed. 1 tablet three times a week, none the rest of the week   methotrexate 2.5 MG tablet Commonly known as: RHEUMATREX Take 30 mg by mouth once a week.   Ventolin HFA 108 (90 Base) MCG/ACT inhaler Generic drug: albuterol SMARTSIG:1-2 Puff(s) By Mouth Every 6 Hours PRN          OBJECTIVE:   PHYSICAL  EXAM: VS: There were no vitals taken for this visit.   EXAM: General: Pt appears well and is in NAD  Neck: General: Supple without adenopathy. Thyroid: Thyroid gland is prominent  Lungs: Clear with good BS bilat with no rales, rhonchi, or wheezes  Heart: Auscultation: RRR.  Abdomen: Normoactive bowel sounds, soft, nontender, without masses or organomegaly palpable  Extremities:  BL LE: trace pretibial edema   Mental Status:  Mood and affect: No depression, anxiety, or agitation     DATA REVIEWED:    Latest Reference Range & Units 01/02/23 09:12  TSH 0.35 - 5.50 uIU/mL 1.63  T4,Free(Direct) 0.60 - 1.60 ng/dL 5.78       Latest Reference Range & Units 06/24/21 09:38  ALDOSTERONE 0.0 - 30.0 ng/dL 2.7  Renin 4.696 - 2.952 ng/mL/hr 0.944  ALDOS/RENIN RATIO 0.0 - 30.0  2.9    ASSESSMENT / PLAN / RECOMMENDATIONS:   Hyperthyroidism:  - Most likely due to Graves' disease - No nodules on thyroid  ultrasound 11/2019 - Pt is clinically euthyroid  -TFTs remain within normal range, no changes at this time   Medications   Continue   methimazole 5 mg , 1 tablet three times weekly     Follow-up in 6 months      Signed electronically by: Lyndle Herrlich, MD  [facility]  [facility] [address] [place] [place], [place] [address] Phone: [phone] FAX: [phone]      CC: Lewis Moccasin, MD [address] [place] [address] Phone: [phone]  Fax: [phone]   Return to Endocrinology clinic as below: Future Appointments  Date Time Provider Department Center  08/24/2023 11:30 AM Darron Stuck, Konrad Dolores, MD [facility] None  10/23/2023  9:00 AM Ashok Croon, MD [facility] None

## 2023-08-25 ENCOUNTER — Encounter: Payer: Self-pay | Admitting: Internal Medicine

## 2023-08-25 LAB — T4, FREE: Free T4: 1.2 ng/dL (ref 0.8–1.8)

## 2023-08-25 LAB — TSH: TSH: 1.04 m[IU]/L

## 2023-08-25 MED ORDER — METHIMAZOLE 5 MG PO TABS: 5.0000 mg | ORAL_TABLET | ORAL | 3 refills | Status: DC

## 2023-09-26 ENCOUNTER — Other Ambulatory Visit: Payer: Self-pay | Admitting: Nurse Practitioner

## 2023-09-26 NOTE — Telephone Encounter (Signed)
 Please advise as it is not in med list

## 2023-09-27 DIAGNOSIS — D8989 Other specified disorders involving the immune mechanism, not elsewhere classified: Secondary | ICD-10-CM | POA: Diagnosis not present

## 2023-09-27 DIAGNOSIS — R590 Localized enlarged lymph nodes: Secondary | ICD-10-CM | POA: Diagnosis not present

## 2023-09-27 DIAGNOSIS — H02849 Edema of unspecified eye, unspecified eyelid: Secondary | ICD-10-CM | POA: Diagnosis not present

## 2023-09-27 DIAGNOSIS — R5382 Chronic fatigue, unspecified: Secondary | ICD-10-CM | POA: Diagnosis not present

## 2023-09-28 NOTE — Telephone Encounter (Signed)
 Copied from CRM [id number]. Topic: Clinical - Prescription Issue >> Sep 28, 2023 12:46 PM Brittney Bass wrote: Reason for CRM: Patient states the pharmacy has sent request for her asthma medication,due to the triggers for seasonal changes - she has stopped taking it & would like it to be prescribed again.   Medication: Montelukast  (Singulair )   Routing to [place] to approve

## 2023-10-23 ENCOUNTER — Ambulatory Visit (INDEPENDENT_AMBULATORY_CARE_PROVIDER_SITE_OTHER): Payer: BC Managed Care – PPO | Admitting: Otolaryngology

## 2023-12-27 DIAGNOSIS — D8989 Other specified disorders involving the immune mechanism, not elsewhere classified: Secondary | ICD-10-CM | POA: Diagnosis not present

## 2024-01-24 DIAGNOSIS — L309 Dermatitis, unspecified: Secondary | ICD-10-CM | POA: Diagnosis not present

## 2024-01-24 DIAGNOSIS — Z91018 Allergy to other foods: Secondary | ICD-10-CM | POA: Diagnosis not present

## 2024-01-24 DIAGNOSIS — N760 Acute vaginitis: Secondary | ICD-10-CM | POA: Diagnosis not present

## 2024-01-24 DIAGNOSIS — J4540 Moderate persistent asthma, uncomplicated: Secondary | ICD-10-CM | POA: Diagnosis not present

## 2024-02-26 ENCOUNTER — Ambulatory Visit: Admitting: Internal Medicine

## 2024-02-29 DIAGNOSIS — R040 Epistaxis: Secondary | ICD-10-CM | POA: Diagnosis not present

## 2024-02-29 DIAGNOSIS — J3489 Other specified disorders of nose and nasal sinuses: Secondary | ICD-10-CM | POA: Diagnosis not present

## 2024-02-29 DIAGNOSIS — R490 Dysphonia: Secondary | ICD-10-CM | POA: Diagnosis not present

## 2024-03-01 DIAGNOSIS — R490 Dysphonia: Secondary | ICD-10-CM | POA: Diagnosis not present

## 2024-03-05 ENCOUNTER — Other Ambulatory Visit

## 2024-03-18 DIAGNOSIS — D8989 Other specified disorders involving the immune mechanism, not elsewhere classified: Secondary | ICD-10-CM | POA: Diagnosis not present

## 2024-03-18 DIAGNOSIS — R5382 Chronic fatigue, unspecified: Secondary | ICD-10-CM | POA: Diagnosis not present

## 2024-03-18 DIAGNOSIS — R590 Localized enlarged lymph nodes: Secondary | ICD-10-CM | POA: Diagnosis not present

## 2024-03-18 DIAGNOSIS — H02849 Edema of unspecified eye, unspecified eyelid: Secondary | ICD-10-CM | POA: Diagnosis not present

## 2024-03-22 DIAGNOSIS — Z0000 Encounter for general adult medical examination without abnormal findings: Secondary | ICD-10-CM | POA: Diagnosis not present

## 2024-03-22 DIAGNOSIS — D649 Anemia, unspecified: Secondary | ICD-10-CM | POA: Diagnosis not present

## 2024-04-08 ENCOUNTER — Other Ambulatory Visit: Payer: Self-pay | Admitting: Internal Medicine

## 2024-04-12 DIAGNOSIS — Z124 Encounter for screening for malignant neoplasm of cervix: Secondary | ICD-10-CM | POA: Diagnosis not present

## 2024-04-12 DIAGNOSIS — L900 Lichen sclerosus et atrophicus: Secondary | ICD-10-CM | POA: Diagnosis not present

## 2024-04-12 DIAGNOSIS — N760 Acute vaginitis: Secondary | ICD-10-CM | POA: Diagnosis not present

## 2024-05-22 DIAGNOSIS — Z91013 Allergy to seafood: Secondary | ICD-10-CM | POA: Diagnosis not present

## 2024-05-22 DIAGNOSIS — D721 Eosinophilia, unspecified: Secondary | ICD-10-CM | POA: Diagnosis not present

## 2024-05-22 DIAGNOSIS — R7982 Elevated C-reactive protein (CRP): Secondary | ICD-10-CM | POA: Diagnosis not present

## 2024-05-22 DIAGNOSIS — Z91010 Allergy to peanuts: Secondary | ICD-10-CM | POA: Diagnosis not present

## 2024-05-27 DIAGNOSIS — R053 Chronic cough: Secondary | ICD-10-CM | POA: Diagnosis not present

## 2024-06-03 DIAGNOSIS — J370 Chronic laryngitis: Secondary | ICD-10-CM | POA: Diagnosis not present

## 2024-06-03 DIAGNOSIS — Z91018 Allergy to other foods: Secondary | ICD-10-CM | POA: Diagnosis not present

## 2024-06-03 DIAGNOSIS — M1990 Unspecified osteoarthritis, unspecified site: Secondary | ICD-10-CM | POA: Diagnosis not present

## 2024-06-03 DIAGNOSIS — Z800 Family history of malignant neoplasm of digestive organs: Secondary | ICD-10-CM | POA: Diagnosis not present
